# Patient Record
Sex: Female | Born: 1955
Health system: Southern US, Community
[De-identification: ages and names within clinical notes are randomized; demographics above are authoritative.]

## PROBLEM LIST (undated history)

## (undated) DIAGNOSIS — T8859XA Other complications of anesthesia, initial encounter: Secondary | ICD-10-CM

## (undated) DIAGNOSIS — R55 Syncope and collapse: Secondary | ICD-10-CM

## (undated) DIAGNOSIS — B379 Candidiasis, unspecified: Secondary | ICD-10-CM

## (undated) DIAGNOSIS — D682 Hereditary deficiency of other clotting factors: Secondary | ICD-10-CM

## (undated) DIAGNOSIS — D649 Anemia, unspecified: Secondary | ICD-10-CM

## (undated) DIAGNOSIS — I341 Nonrheumatic mitral (valve) prolapse: Secondary | ICD-10-CM

## (undated) DIAGNOSIS — R5383 Other fatigue: Secondary | ICD-10-CM

## (undated) DIAGNOSIS — D126 Benign neoplasm of colon, unspecified: Secondary | ICD-10-CM

## (undated) DIAGNOSIS — Z87442 Personal history of urinary calculi: Secondary | ICD-10-CM

## (undated) DIAGNOSIS — C4491 Basal cell carcinoma of skin, unspecified: Secondary | ICD-10-CM

## (undated) DIAGNOSIS — R7989 Other specified abnormal findings of blood chemistry: Secondary | ICD-10-CM

## (undated) DIAGNOSIS — M8580 Other specified disorders of bone density and structure, unspecified site: Secondary | ICD-10-CM

## (undated) DIAGNOSIS — L719 Rosacea, unspecified: Secondary | ICD-10-CM

## (undated) DIAGNOSIS — H33319 Horseshoe tear of retina without detachment, unspecified eye: Secondary | ICD-10-CM

## (undated) DIAGNOSIS — T4145XA Adverse effect of unspecified anesthetic, initial encounter: Secondary | ICD-10-CM

## (undated) DIAGNOSIS — Z8489 Family history of other specified conditions: Secondary | ICD-10-CM

## (undated) HISTORY — DX: Horseshoe tear of retina without detachment, unspecified eye: H33.319

## (undated) HISTORY — DX: Other specified abnormal findings of blood chemistry: R79.89

## (undated) HISTORY — DX: Syncope and collapse: R55

## (undated) HISTORY — PX: WISDOM TOOTH EXTRACTION: SHX21

## (undated) HISTORY — DX: Rosacea, unspecified: L71.9

## (undated) HISTORY — DX: Basal cell carcinoma of skin, unspecified: C44.91

## (undated) HISTORY — DX: Other fatigue: R53.83

## (undated) HISTORY — DX: Candidiasis, unspecified: B37.9

## (undated) HISTORY — DX: Benign neoplasm of colon, unspecified: D12.6

## (undated) HISTORY — PX: BASAL CELL CARCINOMA EXCISION: SHX1214

## (undated) HISTORY — DX: Hereditary deficiency of other clotting factors: D68.2

## (undated) HISTORY — DX: Other specified disorders of bone density and structure, unspecified site: M85.80

---

## 2005-01-02 ENCOUNTER — Ambulatory Visit: Payer: Self-pay | Admitting: Obstetrics and Gynecology

## 2006-01-08 ENCOUNTER — Ambulatory Visit: Payer: Self-pay | Admitting: Obstetrics and Gynecology

## 2007-01-23 ENCOUNTER — Ambulatory Visit: Payer: Self-pay | Admitting: Obstetrics and Gynecology

## 2008-02-03 ENCOUNTER — Ambulatory Visit: Payer: Self-pay | Admitting: Obstetrics and Gynecology

## 2008-02-16 ENCOUNTER — Ambulatory Visit: Payer: Self-pay | Admitting: Obstetrics and Gynecology

## 2008-05-21 HISTORY — PX: VAGINAL HYSTERECTOMY: SUR661

## 2008-07-27 ENCOUNTER — Ambulatory Visit: Payer: Self-pay | Admitting: Obstetrics and Gynecology

## 2009-03-09 ENCOUNTER — Ambulatory Visit: Payer: Self-pay | Admitting: Obstetrics & Gynecology

## 2010-04-19 ENCOUNTER — Ambulatory Visit: Payer: Self-pay | Admitting: Obstetrics and Gynecology

## 2010-05-21 LAB — HM COLONOSCOPY

## 2010-07-27 ENCOUNTER — Ambulatory Visit: Payer: Self-pay | Admitting: Urology

## 2010-08-07 ENCOUNTER — Ambulatory Visit: Payer: Self-pay | Admitting: Unknown Physician Specialty

## 2010-08-08 LAB — PATHOLOGY REPORT

## 2011-02-26 ENCOUNTER — Ambulatory Visit: Payer: Self-pay | Admitting: Urology

## 2011-08-09 ENCOUNTER — Ambulatory Visit: Payer: Self-pay | Admitting: Obstetrics and Gynecology

## 2011-11-19 ENCOUNTER — Ambulatory Visit: Payer: Self-pay | Admitting: Urology

## 2012-02-26 ENCOUNTER — Ambulatory Visit: Payer: Self-pay | Admitting: Urology

## 2012-08-21 ENCOUNTER — Ambulatory Visit: Payer: Self-pay | Admitting: Obstetrics and Gynecology

## 2013-09-03 ENCOUNTER — Ambulatory Visit: Payer: Self-pay | Admitting: Obstetrics and Gynecology

## 2013-11-12 DIAGNOSIS — M79609 Pain in unspecified limb: Secondary | ICD-10-CM

## 2014-06-17 LAB — HM PAP SMEAR: HM PAP: NEGATIVE

## 2014-09-06 ENCOUNTER — Ambulatory Visit
Admit: 2014-09-06 | Disposition: A | Discharge status: Home / Self Care | Payer: Self-pay | Attending: Obstetrics and Gynecology | Admitting: Obstetrics and Gynecology

## 2014-09-06 LAB — HM MAMMOGRAPHY

## 2015-01-25 ENCOUNTER — Telehealth: Payer: Self-pay | Admitting: Obstetrics and Gynecology

## 2015-01-25 DIAGNOSIS — R143 Flatulence: Secondary | ICD-10-CM

## 2015-01-25 DIAGNOSIS — IMO0001 Reserved for inherently not codable concepts without codable children: Secondary | ICD-10-CM

## 2015-01-25 NOTE — Telephone Encounter (Signed)
Put in a referral per pt request to Margaret May at [facility]. Referral request signed by MAD and mailed to pt.

## 2015-01-25 NOTE — Telephone Encounter (Signed)
PT CALLED ND SHE WORKS AT [employer] AND THEY ARE DOING A NEW WELLNESS PROGRAM AND SHE WANTED TO KOW IF DR DE WOULD DO A REFERRAL TO SEE THE REGISTERED [place], [place].

## 2015-05-17 ENCOUNTER — Telehealth: Payer: Self-pay | Admitting: Obstetrics and Gynecology

## 2015-05-17 NOTE — Telephone Encounter (Signed)
She needs refill on estrogen

## 2015-05-18 MED ORDER — ESTRADIOL 0.025 MG/24HR TD PTTW: 1.0000 | MEDICATED_PATCH | TRANSDERMAL | Status: DC

## 2015-05-18 NOTE — Telephone Encounter (Signed)
Pt requested a 90 days supply sent to [facility] scheduled for 06/2015. Pt aware med erx.

## 2015-05-20 ENCOUNTER — Other Ambulatory Visit: Payer: Self-pay | Admitting: Obstetrics and Gynecology

## 2015-05-22 LAB — FECAL OCCULT BLOOD, IMMUNOCHEMICAL: Fecal Occult Bld: NEGATIVE

## 2015-06-22 ENCOUNTER — Encounter: Payer: Self-pay | Admitting: Obstetrics and Gynecology

## 2015-06-22 ENCOUNTER — Ambulatory Visit (INDEPENDENT_AMBULATORY_CARE_PROVIDER_SITE_OTHER): Payer: BLUE CROSS/BLUE SHIELD | Admitting: Obstetrics and Gynecology

## 2015-06-22 VITALS — BP 134/73 | HR 84 | Ht 65.0 in | Wt 124.8 lb

## 2015-06-22 DIAGNOSIS — D126 Benign neoplasm of colon, unspecified: Secondary | ICD-10-CM | POA: Diagnosis not present

## 2015-06-22 DIAGNOSIS — Z1239 Encounter for other screening for malignant neoplasm of breast: Secondary | ICD-10-CM

## 2015-06-22 DIAGNOSIS — Z8742 Personal history of other diseases of the female genital tract: Secondary | ICD-10-CM

## 2015-06-22 DIAGNOSIS — N951 Menopausal and female climacteric states: Secondary | ICD-10-CM | POA: Insufficient documentation

## 2015-06-22 DIAGNOSIS — M8580 Other specified disorders of bone density and structure, unspecified site: Secondary | ICD-10-CM | POA: Diagnosis not present

## 2015-06-22 DIAGNOSIS — Z87442 Personal history of urinary calculi: Secondary | ICD-10-CM | POA: Diagnosis not present

## 2015-06-22 DIAGNOSIS — Z780 Asymptomatic menopausal state: Secondary | ICD-10-CM | POA: Insufficient documentation

## 2015-06-22 DIAGNOSIS — C44519 Basal cell carcinoma of skin of other part of trunk: Secondary | ICD-10-CM | POA: Diagnosis not present

## 2015-06-22 DIAGNOSIS — Z01419 Encounter for gynecological examination (general) (routine) without abnormal findings: Secondary | ICD-10-CM

## 2015-06-22 MED ORDER — ESTRADIOL 0.025 MG/24HR TD PTTW: 1.0000 | MEDICATED_PATCH | TRANSDERMAL | Status: DC

## 2015-06-22 NOTE — Progress Notes (Signed)
Patient ID: Sharon Le, female   DOB: 1955/08/01, 60 y.o.   MRN: [id number] ANNUAL PREVENTATIVE CARE GYN  ENCOUNTER NOTE  Subjective:       Sharon Le is a 60 y.o. G0P0000 female here for a routine annual gynecologic exam.  Current complaints: 1.  Remote history of abnormal Pap smears; none in the last decade; Want pap- doesn't not like 3 year plan   Major interval health issues include bilateral retinal tears which have healed.  She also has had recent skin excision forRule out melanoma; pathology pending.   Gynecologic History No LMP recorded. Patient has had a hysterectomy.status post [facility] Contraception: post menopausal status Last Pap: 06/17/2014 neg. Results were: normal Last mammogram: birad 1. Results were: normal  Obstetric History OB History  Gravida Para Term Preterm AB SAB TAB Ectopic Multiple Living  0 0 0 0 0 0 0 0 0 0         Past Medical History  Diagnosis Date  . Fatigue   . Kidney stones     h/o  . Adenomatous colon polyp   . 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 Osteopenia   . Vasomotor instability   . Yeast infection   . Abnormal TSH   . Basal cell carcinoma   . Retinal tear     Past Surgical History  Procedure Laterality Date  . Vaginal hysterectomy      lsh  . Basal cell carcinoma excision  abd    Current Outpatient Prescriptions on File Prior to Visit  Medication Sig Dispense Refill  . estradiol (VIVELLE-DOT) 0.025 MG/24HR Place 1 patch onto the skin 2 (two) times a week. 24 patch 0   No current facility-administered medications on file prior to visit.    Allergies no known allergies  Social History   Social History  . Marital Status: Single    Spouse Name: N/A  . Number of Children: N/A  . Years of Education: N/A   Occupational History  . Not on file.   Social History Main Topics  . Smoking status: Never Smoker   . Smokeless tobacco: Not on file  . Alcohol Use: No  . Drug Use: No  . Sexual Activity: Not Currently   Other  Topics Concern  . Not on file   Social History Narrative    Family History  Problem Relation Age of Onset  . Vascular Disease Father   . Colon cancer Paternal Aunt   . Heart disease Maternal Grandmother   . Diabetes Neg Hx   . Breast cancer Neg Hx   . Ovarian cancer Neg Hx     The following portions of the patient's history were reviewed and updated as appropriate: allergies, current medications, past family history, past medical history, past social history, past surgical history and problem list.  Review of Systems ROS Review of Systems - General ROS: negative for - chills, fatigue, fever, hot flashes, night sweats, weight gain or weight loss Psychological ROS: negative for - anxiety, decreased libido, depression, mood swings, physical abuse or sexual abuse Ophthalmic ROS: negative for - blurry vision, eye pain or loss of vision ENT ROS: negative for - headaches, hearing change, visual changes or vocal changes Allergy and Immunology ROS: negative for - hives, itchy/watery eyes or seasonal allergies Hematological and Lymphatic ROS: negative for - bleeding problems, bruising, swollen lymph nodes or weight loss Endocrine ROS: negative for - galactorrhea, hair pattern changes, hot flashes, malaise/lethargy, mood swings, palpitations, polydipsia/polyuria, skin changes, temperature intolerance or  unexpected weight changes Breast ROS: negative for - new or changing breast lumps or nipple discharge Respiratory ROS: negative for - cough or shortness of breath Cardiovascular ROS: negative for - chest pain, irregular heartbeat, palpitations or shortness of breath Gastrointestinal ROS: no abdominal pain, change in bowel habits, or black or bloody stools Genito-Urinary ROS: no dysuria, trouble voiding, or hematuria Musculoskeletal ROS: negative for - joint pain or joint stiffness Neurological ROS: negative for - bowel and bladder control changes Dermatological ROS: negative for rash and skin  lesion changes   Objective:   BP 134/73 mmHg  Pulse 84  Ht 5\' 5"  (1.651 m)  Wt 124 lb 12.8 oz (56.609 kg)  BMI 20.77 kg/m2 CONSTITUTIONAL: Well-developed, well-nourished female in no acute distress.  PSYCHIATRIC: Normal mood and affect. Normal behavior. Normal judgment and thought content. [place]: Alert and oriented to person, place, and time. Normal muscle tone coordination. No cranial nerve deficit noted. HENT:  Normocephalic, atraumatic, External right and left ear normal. Oropharynx is clear and moist EYES: Conjunctivae and EOM are normal. Pupils are equal, round, and reactive to light. No scleral icterus.  NECK: Normal range of motion, supple, no masses.  Normal thyroid.  SKIN: Skin is warm and dry. No rash noted. Not diaphoretic. No erythema. No pallor. CARDIOVASCULAR: Normal heart rate noted, regular rhythm, no murmur. RESPIRATORY: Clear to auscultation bilaterally. Effort and breath sounds normal, no problems with respiration noted. BREASTS: Symmetric in size. No masses, skin changes, nipple drainage, or lymphadenopathy. ABDOMEN: Soft, normal bowel sounds, no distention noted.  No tenderness, rebound or guarding.  BLADDER: Normal PELVIC:  External Genitalia: Normal  BUS: Normal  Vagina: Normal  Cervix: Normal  Uterus: surgically absent  Adnexa: Normal  RV: External Exam NormaI, No Rectal Masses and Normal Sphincter tone  MUSCULOSKELETAL: Normal range of motion. No tenderness.  No cyanosis, clubbing, or edema.  2+ distal pulses. LYMPHATIC: No Axillary, Supraclavicular, or Inguinal Adenopathy.    Assessment:   Annual gynecologic examination 60 y.o. Contraception: post menopausal status bmi-20  Status post [place] Remote history of abnormal Pap smears; patient desires annual Pap smear testing.  Plan:  Pap: Pap, Reflex if ASCUS Mammogram: Ordered Stool Guaiac Testing:  colonoscopy scheduled for 07/2015 Labs: vit d lipid a1c fbs tsh Routine preventative health  maintenance measures emphasized: Exercise/Diet/Weight control, Tobacco Warnings and Alcohol/Substance use risks Vivelle-Dot is refilled for 1 year Return to [place], CMA  Brayton Mars, MD  Note: This dictation was prepared with Dragon dictation along with smaller phrase technology. Any transcriptional errors that result from this process are unintentional.

## 2015-06-22 NOTE — Patient Instructions (Signed)
1.  Pap smear is completed today. 2.  Mammogram is ordered. 3.  Stool guaiac cards are not given because colonoscopy will be done this year. 4.  Continue with healthy eating and exercise. 5.  The Vivelle-Dot patch is refilled for 1 year. 6.  Return in 1 year for physical

## 2015-06-24 LAB — PAP IG W/ RFLX HPV ASCU: PAP Smear Comment: 0

## 2015-06-27 ENCOUNTER — Encounter: Payer: Self-pay | Admitting: Obstetrics and Gynecology

## 2015-08-19 DIAGNOSIS — Z86010 Personal history of colonic polyps: Secondary | ICD-10-CM | POA: Insufficient documentation

## 2015-08-19 DIAGNOSIS — K5909 Other constipation: Secondary | ICD-10-CM | POA: Insufficient documentation

## 2015-09-07 ENCOUNTER — Ambulatory Visit
Admission: RE | Admit: 2015-09-07 | Discharge: 2015-09-07 | Disposition: A | Discharge status: Home / Self Care | Payer: BLUE CROSS/BLUE SHIELD | Source: Ambulatory Visit | Attending: Obstetrics and Gynecology | Admitting: Obstetrics and Gynecology

## 2015-09-07 DIAGNOSIS — Z1231 Encounter for screening mammogram for malignant neoplasm of breast: Secondary | ICD-10-CM | POA: Insufficient documentation

## 2015-09-07 DIAGNOSIS — Z1239 Encounter for other screening for malignant neoplasm of breast: Secondary | ICD-10-CM

## 2015-10-05 ENCOUNTER — Ambulatory Visit: Payer: BLUE CROSS/BLUE SHIELD | Admitting: Anesthesiology

## 2015-10-05 ENCOUNTER — Ambulatory Visit
Admission: RE | Admit: 2015-10-05 | Discharge: 2015-10-05 | Disposition: A | Discharge status: Home / Self Care | Payer: BLUE CROSS/BLUE SHIELD | Source: Ambulatory Visit | Attending: Unknown Physician Specialty | Admitting: Unknown Physician Specialty

## 2015-10-05 ENCOUNTER — Encounter: Payer: Self-pay | Admitting: Anesthesiology

## 2015-10-05 ENCOUNTER — Encounter
Admission: RE | Disposition: A | Discharge status: Home / Self Care | Payer: Self-pay | Source: Ambulatory Visit | Attending: Unknown Physician Specialty

## 2015-10-05 DIAGNOSIS — K648 Other hemorrhoids: Secondary | ICD-10-CM | POA: Diagnosis not present

## 2015-10-05 DIAGNOSIS — Z79899 Other long term (current) drug therapy: Secondary | ICD-10-CM | POA: Diagnosis not present

## 2015-10-05 DIAGNOSIS — M8580 Other specified disorders of bone density and structure, unspecified site: Secondary | ICD-10-CM | POA: Insufficient documentation

## 2015-10-05 DIAGNOSIS — Z87442 Personal history of urinary calculi: Secondary | ICD-10-CM | POA: Diagnosis not present

## 2015-10-05 DIAGNOSIS — Z1211 Encounter for screening for malignant neoplasm of colon: Secondary | ICD-10-CM | POA: Diagnosis not present

## 2015-10-05 DIAGNOSIS — Z85828 Personal history of other malignant neoplasm of skin: Secondary | ICD-10-CM | POA: Diagnosis not present

## 2015-10-05 DIAGNOSIS — Z86010 Personal history of colonic polyps: Secondary | ICD-10-CM | POA: Diagnosis not present

## 2015-10-05 DIAGNOSIS — D125 Benign neoplasm of sigmoid colon: Secondary | ICD-10-CM | POA: Insufficient documentation

## 2015-10-05 DIAGNOSIS — D122 Benign neoplasm of ascending colon: Secondary | ICD-10-CM | POA: Insufficient documentation

## 2015-10-05 DIAGNOSIS — K640 First degree hemorrhoids: Secondary | ICD-10-CM | POA: Diagnosis not present

## 2015-10-05 DIAGNOSIS — K635 Polyp of colon: Secondary | ICD-10-CM | POA: Diagnosis not present

## 2015-10-05 HISTORY — PX: COLONOSCOPY WITH PROPOFOL: SHX5780

## 2015-10-05 SURGERY — COLONOSCOPY WITH PROPOFOL
Anesthesia: General

## 2015-10-05 MED ORDER — SODIUM CHLORIDE 0.9 % IV SOLN
INTRAVENOUS | Status: DC
  Administered 2015-10-05 (×2): via INTRAVENOUS

## 2015-10-05 MED ORDER — SODIUM CHLORIDE 0.9 % IV SOLN: INTRAVENOUS | Status: DC

## 2015-10-05 MED ORDER — PHENYLEPHRINE HCL 10 MG/ML IJ SOLN
INTRAMUSCULAR | Status: DC | PRN
Start: 2015-10-05 — End: 2015-10-05
  Administered 2015-10-05: 100 ug via INTRAVENOUS

## 2015-10-05 MED ORDER — FENTANYL CITRATE (PF) 100 MCG/2ML IJ SOLN
INTRAMUSCULAR | Status: DC | PRN
  Administered 2015-10-05: 50 ug via INTRAVENOUS

## 2015-10-05 MED ORDER — MIDAZOLAM HCL 2 MG/2ML IJ SOLN
INTRAMUSCULAR | Status: DC | PRN
  Administered 2015-10-05: 1 mg via INTRAVENOUS

## 2015-10-05 MED ORDER — PROPOFOL 500 MG/50ML IV EMUL
INTRAVENOUS | Status: DC | PRN
  Administered 2015-10-05: 100 ug/kg/min via INTRAVENOUS

## 2015-10-05 MED ORDER — LIDOCAINE HCL (PF) 1 % IJ SOLN: 2.0000 mL | Freq: Once | INTRAMUSCULAR | Status: DC

## 2015-10-05 NOTE — Anesthesia Procedure Notes (Signed)
Performed by: COOK-MARTIN, Dilraj Killgore Pre-anesthesia Checklist: Patient identified, Emergency Drugs available, Suction available, Patient being monitored and Timeout performed Patient Re-evaluated:Patient Re-evaluated prior to inductionOxygen Delivery Method: Nasal cannula Preoxygenation: Pre-oxygenation with 100% oxygen Intubation Type: IV induction Placement Confirmation: positive ETCO2 and CO2 detector       

## 2015-10-05 NOTE — OR Nursing (Signed)
Hot Snared Sigmoid Colon Polyp that wasn't retrieved.

## 2015-10-05 NOTE — Anesthesia Preprocedure Evaluation (Addendum)
Anesthesia Evaluation  Patient identified by MRN, date of birth, ID band Patient awake    Reviewed: Allergy & Precautions, NPO status , Patient's Chart, lab work & pertinent test results, reviewed documented beta blocker date and time   Airway Mallampati: II  TM Distance: >3 FB     Dental  (+) Chipped   Pulmonary           Cardiovascular      Neuro/Psych    GI/Hepatic   Endo/Other    Renal/GU Renal InsufficiencyRenal disease     Musculoskeletal   Abdominal   Peds  Hematology   Anesthesia Other Findings Hx of retinal tears. Decreased fibrogen.  Reproductive/Obstetrics                            Anesthesia Physical Anesthesia Plan  ASA: III  Anesthesia Plan: General   Post-op Pain Management:    Induction: Intravenous  Airway Management Planned: Nasal Cannula  Additional Equipment:   Intra-op Plan:   Post-operative Plan:   Informed Consent: I have reviewed the patients History and Physical, chart, labs and discussed the procedure including the risks, benefits and alternatives for the proposed anesthesia with the patient or authorized representative who has indicated his/her understanding and acceptance.     Plan Discussed with: CRNA  Anesthesia Plan Comments:         Anesthesia Quick Evaluation

## 2015-10-05 NOTE — Op Note (Signed)
[facility] Gastroenterology Patient Name: Sharon Le Procedure Date: 10/05/2015 1:30 PM MRN: [id number] Account #: [id number] Date of Birth: 1955-09-12 Admit Type: Outpatient Age: 60 Room: [facility] ENDO ROOM 4 Gender: Female Note Status: Finalized Procedure:            Colonoscopy Indications:          High risk colon cancer surveillance: Personal history                        of colonic polyps Providers:            Manya Silvas, MD Referring MD:         Alanda Slim. Defrancesco, MD (Referring MD) Medicines:            Propofol per Anesthesia Complications:        No immediate complications. Procedure:            Pre-Anesthesia Assessment:                       - After reviewing the risks and benefits, the patient                        was deemed in satisfactory condition to undergo the                        procedure.                       After obtaining informed consent, the colonoscope was                        passed under direct vision. Throughout the procedure,                        the patient's blood pressure, pulse, and oxygen                        saturations were monitored continuously. The                        Colonoscope was introduced through the anus and                        advanced to the the cecum, identified by appendiceal                        orifice and ileocecal valve. The colonoscopy was                        somewhat difficult due to a tortuous colon. The patient                        tolerated the procedure well. The quality of the bowel                        preparation was excellent. Findings:      A diminutive polyp was found in the proximal ascending colon. The polyp       was sessile. The polyp was removed with a cold biopsy forceps. Resection       and retrieval were complete.  A diminutive polyp was found in the proximal sigmoid colon. The polyp       was sessile. The polyp was removed with a hot snare.  Resection and       retrieval were complete.      Internal hemorrhoids were found during endoscopy. The hemorrhoids were       small and Grade I (internal hemorrhoids that do not prolapse).      The exam was otherwise without abnormality. Impression:           - One diminutive polyp in the proximal ascending colon,                        removed with a cold biopsy forceps. Resected and                        retrieved.                       - One diminutive polyp in the proximal sigmoid colon,                        removed with a hot snare. Resected and retrieved.                       - Internal hemorrhoids.                       - The examination was otherwise normal. Recommendation:       - Await pathology results. Manya Silvas, MD 10/05/2015 2:06:33 PM This report has been signed electronically. Number of Addenda: 0 Note Initiated On: 10/05/2015 1:30 PM Scope Withdrawal Time: 0 hours 18 minutes 32 seconds  Total Procedure Duration: 0 hours 28 minutes 26 seconds       [facility]

## 2015-10-05 NOTE — Transfer of Care (Signed)
Immediate Anesthesia Transfer of Care Note  Patient: Sharon Le  Procedure(s) Performed: Procedure(s): COLONOSCOPY WITH PROPOFOL (N/A)  Patient Location: Endoscopy Unit  Anesthesia Type:General  Level of Consciousness: sedated  Airway & Oxygen Therapy: Patient connected to nasal cannula oxygen  Post-op Assessment: Post -op Vital signs reviewed and stable  Post vital signs: stable  Last Vitals:  Filed Vitals:   10/05/15 1203 10/05/15 1406  BP: 139/99 126/58  Pulse: 64 68  Temp: 36.1 C 35.7 C  Resp: 17 16    Last Pain: There were no vitals filed for this visit.       Complications: No apparent anesthesia complications

## 2015-10-05 NOTE — H&P (Signed)
   Primary Care Physician:  Brayton Mars, MD Primary Gastroenterologist:  Dr. Vira Agar  Pre-Procedure History & Physical: HPI:  Sharon Le is a 60 y.o. female is here for an colonoscopy.   Past Medical History  Diagnosis Date  . Fatigue   . Kidney stones     h/o  . Adenomatous colon polyp   . Decreased fibrinogen ([place])   . Osteopenia   . Vasomotor instability   . Yeast infection   . Abnormal TSH   . Basal cell carcinoma   . Retinal tear     Past Surgical History  Procedure Laterality Date  . Vaginal hysterectomy      lsh  . Basal cell carcinoma excision  abd    Prior to Admission medications   Medication Sig Start Date End Date Taking? Authorizing Provider  estradiol (VIVELLE-DOT) 0.025 MG/24HR Place 1 patch onto the skin 2 (two) times a week. 06/22/15   Brayton Mars, MD    Allergies as of 09/08/2015  . (No Known Allergies)    Family History  Problem Relation Age of Onset  . Vascular Disease Father   . Colon cancer Paternal Aunt   . Heart disease Maternal Grandmother   . Diabetes Neg Hx   . Breast cancer Neg Hx   . Ovarian cancer Neg Hx     Social History   Social History  . Marital Status: Single    Spouse Name: N/A  . Number of Children: N/A  . Years of Education: N/A   Occupational History  . Not on file.   Social History Main Topics  . Smoking status: Never Smoker   . Smokeless tobacco: Not on file  . Alcohol Use: No  . Drug Use: No  . Sexual Activity: Not Currently   Other Topics Concern  . Not on file   Social History Narrative    Review of Systems: See HPI, otherwise negative ROS  Physical Exam: BP 139/99 mmHg  Pulse 64  Temp(Src) 97 F (36.1 C) (Tympanic)  Resp 17  Ht 5\' 5"  (1.651 m)  Wt 52.617 kg (116 lb)  BMI 19.30 kg/m2  SpO2 100% General:   Alert,  pleasant and cooperative in NAD Head:  Normocephalic and atraumatic. Neck:  Supple; no masses or thyromegaly. Lungs:  Clear throughout to auscultation.     Heart:  Regular rate and rhythm. Abdomen:  Soft, nontender and nondistended. Normal bowel sounds, without guarding, and without rebound.   Neurologic:  Alert and  oriented x4;  grossly normal neurologically.  Impression/Plan: Sharon Le is here for an colonoscopy to be performed for [facility] colon polyps  Risks, benefits, limitations, and alternatives regarding  colonoscopy have been reviewed with the patient.  Questions have been answered.  All parties agreeable.   Gaylyn Cheers, MD  10/05/2015, 12:57 PM

## 2015-10-06 ENCOUNTER — Encounter: Payer: Self-pay | Admitting: Unknown Physician Specialty

## 2015-10-06 NOTE — Anesthesia Postprocedure Evaluation (Signed)
Anesthesia Post Note  Patient: Sharon Le  Procedure(s) Performed: Procedure(s) (LRB): COLONOSCOPY WITH PROPOFOL (N/A)  Patient location during evaluation: Endoscopy Anesthesia Type: General Level of consciousness: awake and alert Pain management: pain level controlled Vital Signs Assessment: post-procedure vital signs reviewed and stable Respiratory status: spontaneous breathing, nonlabored ventilation, respiratory function stable and patient connected to nasal cannula oxygen Cardiovascular status: blood pressure returned to baseline and stable Postop Assessment: no signs of nausea or vomiting Anesthetic complications: no    Last Vitals:  Filed Vitals:   10/05/15 1426 10/05/15 1436  BP: 117/68 125/78  Pulse: 70 59  Temp:    Resp: 16 9    Last Pain: There were no vitals filed for this visit.               Martha Clan

## 2015-10-07 LAB — SURGICAL PATHOLOGY

## 2016-02-03 ENCOUNTER — Telehealth: Payer: Self-pay | Admitting: Obstetrics and Gynecology

## 2016-02-03 NOTE — Telephone Encounter (Signed)
PT CALLED AND STATED THAT SHE FAXED OVER A FORM FROM HER [employer] [employer] FOR A SIGN OFF FOR PARTICIPATING IN A RESEARCH ON CAMPUS. SHE TALKED TO THE DOCTOR THAT IS DOING IT AND THEY HAVE NOT RECEIVED IT SO SHE IS WANTING TO KNOW IF IT HAS BEEN DONE OR NOT, PT AWARE YOU WILL NOT BE BACK IN OFFICE UNTIL Monday AND SHE IS FINE WITH THAT, SHE WOULD LIKE A CALL BACK ON Monday AND SHE STATED THAT YOU CAN LEAVE A MESSAGE ON THE NUMBER ABOVE IF SHE DOES NOT ANSWER.

## 2016-02-06 NOTE — Telephone Encounter (Signed)
Pt aware per vm form mailed out Thursday- 02/02/16.

## 2016-03-05 ENCOUNTER — Ambulatory Visit (INDEPENDENT_AMBULATORY_CARE_PROVIDER_SITE_OTHER): Payer: BLUE CROSS/BLUE SHIELD | Admitting: Podiatry

## 2016-03-05 ENCOUNTER — Encounter: Payer: Self-pay | Admitting: *Deleted

## 2016-03-05 ENCOUNTER — Ambulatory Visit (INDEPENDENT_AMBULATORY_CARE_PROVIDER_SITE_OTHER): Payer: BLUE CROSS/BLUE SHIELD

## 2016-03-05 ENCOUNTER — Encounter: Payer: Self-pay | Admitting: Podiatry

## 2016-03-05 DIAGNOSIS — M7750 Other enthesopathy of unspecified foot: Secondary | ICD-10-CM

## 2016-03-05 DIAGNOSIS — M778 Other enthesopathies, not elsewhere classified: Secondary | ICD-10-CM

## 2016-03-05 DIAGNOSIS — Q828 Other specified congenital malformations of skin: Secondary | ICD-10-CM

## 2016-03-05 DIAGNOSIS — M779 Enthesopathy, unspecified: Principal | ICD-10-CM

## 2016-03-05 NOTE — Progress Notes (Signed)
   Subjective:    Patient ID: Sharon Le, female    DOB: 1955/12/14, 60 y.o.   MRN: [id number]  HPI: She presents today with a chief concern of sub-first metatarsophalangeal joint pain and tenderness right greater than left for the past several weeks. She's noticed that she is getting more callused areas as time goes on and is becoming more painful to walk.    Review of Systems  Neurological: Positive for headaches.  All other systems reviewed and are negative.      Objective:   Physical Exam vital signs are stable she is alert and oriented 3. Pulses are palpable. Neurologic sensorium is intact. Deep tendon reflexes are intact. Muscle strength is 5 over 5 dorsiflexion plantar flexors and inverters everters all intrinsic musculature is intact. Orthopedic evaluation demonstrates all joints distal to the ankle for range of motion without crepitation. Cutaneous evaluation demonstrates supple well-hydrated cutis multiple porokeratotic lesions and tyloma plantar aspect of bilateral foot appear to be slightly diffuse due to debridement. Nontender on palpation I see no signs of infection or onychomycosis or tinea pedis at this point.        Assessment & Plan:  Metatarsalgia with capsulitis and tyloma is bilateral.  Plan: Discussed appropriate shoe gear debridement devices and will follow up with her as needed.

## 2016-04-04 DIAGNOSIS — J029 Acute pharyngitis, unspecified: Secondary | ICD-10-CM | POA: Diagnosis not present

## 2016-05-24 ENCOUNTER — Telehealth: Payer: Self-pay | Admitting: Obstetrics and Gynecology

## 2016-05-24 DIAGNOSIS — Z01419 Encounter for gynecological examination (general) (routine) without abnormal findings: Secondary | ICD-10-CM

## 2016-05-24 NOTE — Telephone Encounter (Signed)
PT CALLED AND STATED SHE WOULD LIKE FOR YOU TO CALL HER BACK BEFORE 5 TODAY.

## 2016-05-24 NOTE — Telephone Encounter (Signed)
Pt request her annual lab work to be drawn prior to her 06/2016 appt. Per her request faxed to her work- [phone] with conf.

## 2016-05-31 DIAGNOSIS — Z85828 Personal history of other malignant neoplasm of skin: Secondary | ICD-10-CM | POA: Diagnosis not present

## 2016-06-25 ENCOUNTER — Other Ambulatory Visit: Payer: Self-pay

## 2016-06-25 MED ORDER — ESTRADIOL 0.025 MG/24HR TD PTTW: 1.0000 | MEDICATED_PATCH | TRANSDERMAL | 3 refills | Status: DC

## 2016-06-27 ENCOUNTER — Encounter: Payer: BLUE CROSS/BLUE SHIELD | Admitting: Obstetrics and Gynecology

## 2016-07-05 NOTE — Progress Notes (Signed)
Patient ID: Sharon Le, female   DOB: Oct 21, 1955, 61 y.o.   MRN: [id number] ANNUAL PREVENTATIVE CARE GYN  ENCOUNTER NOTE  Subjective:       Sharon Le is a 61 y.o. G0P0000 female here for a routine annual gynecologic exam.  Current complaints: 1.  Remote history of abnormal Pap smears; none in the last decade; Want pap- doesn't not like 3 year plan  2. Wants pap q year until age 89  3. With elevated tsh- wants to see Dr. Gabriel Carina; Patient is asymptomatic from thyroid disease. No weight gain, intolerance, skin changes, hair loss, mental status changes. We'll recommend repeating thyroid panel in 6 months.  Gynecologic History No LMP recorded. Patient has had a hysterectomy.status post [facility] Contraception: post menopausal status Last Pap: 06/22/2015 neg. Results were: normal Last mammogram:09/08/2015  birad 1. Results were: normal  Obstetric History OB History  Gravida Para Term Preterm AB Living  0 0 0 0 0 0  SAB TAB Ectopic Multiple Live Births  0 0 0 0          Past Medical History:  Diagnosis Date  . Abnormal TSH   . Adenomatous colon polyp   . Basal cell carcinoma   . Decreased fibrinogen ([place])   . Fatigue   . Kidney stones    h/o  . Osteopenia   . Retinal tear   . Vasomotor instability   . Yeast infection     Past Surgical History:  Procedure Laterality Date  . BASAL CELL CARCINOMA EXCISION  abd  . COLONOSCOPY WITH PROPOFOL N/A 10/05/2015   Procedure: COLONOSCOPY WITH PROPOFOL;  Surgeon: Manya Silvas, MD;  Location: [facility] ENDOSCOPY;  Service: Endoscopy;  Laterality: N/A;  . VAGINAL HYSTERECTOMY     lsh    Current Outpatient Prescriptions on File Prior to Visit  Medication Sig Dispense Refill  . Azelaic Acid (FINACEA EX) Apply topically.    Marland Kitchen estradiol (VIVELLE-DOT) 0.025 MG/24HR Place 1 patch onto the skin 2 (two) times a week. 24 patch 3  . olopatadine (PATANOL) 0.1 % ophthalmic solution 1 drop 2 (two) times daily.     No current facility-administered  medications on file prior to visit.     No Known Allergies  Social History   Social History  . Marital status: Single    Spouse name: N/A  . Number of children: N/A  . Years of education: N/A   Occupational History  . Not on file.   Social History Main Topics  . Smoking status: Never Smoker  . Smokeless tobacco: Not on file  . Alcohol use No  . Drug use: No  . Sexual activity: Not Currently   Other Topics Concern  . Not on file   Social History Narrative  . No narrative on file    Family History  Problem Relation Age of Onset  . Vascular Disease Father   . Colon cancer Paternal Aunt   . Heart disease Maternal Grandmother   . Diabetes Neg Hx   . Breast cancer Neg Hx   . Ovarian cancer Neg Hx     The following portions of the patient's history were reviewed and updated as appropriate: allergies, current medications, past family history, past medical history, past social history, past surgical history and problem list.  Review of Systems ROS Review of Systems - General ROS: negative for - chills, fatigue, fever, hot flashes, night sweats, weight gain or weight loss Psychological ROS: negative for - anxiety, decreased libido, depression, mood  swings, physical abuse or sexual abuse Ophthalmic ROS: negative for - blurry vision, eye pain or loss of vision ENT ROS: negative for - headaches, hearing change, visual changes or vocal changes Allergy and Immunology ROS: negative for - hives, itchy/watery eyes or seasonal allergies Hematological and Lymphatic ROS: negative for - bleeding problems, bruising, swollen lymph nodes or weight loss Endocrine ROS: negative for - galactorrhea, hair pattern changes, hot flashes, malaise/lethargy, mood swings, palpitations, polydipsia/polyuria, skin changes, temperature intolerance or unexpected weight changes Breast ROS: negative for - new or changing breast lumps or nipple discharge Respiratory ROS: negative for - cough or shortness of  breath Cardiovascular ROS: negative for - chest pain, irregular heartbeat, palpitations or shortness of breath Gastrointestinal ROS: no abdominal pain, change in bowel habits, or black or bloody stools Genito-Urinary ROS: no dysuria, trouble voiding, or hematuria Musculoskeletal ROS: negative for - joint pain or joint stiffness Neurological ROS: negative for - bowel and bladder control changes Dermatological ROS: negative for rash and skin lesion changes   Objective:    BP 127/68   Pulse 80   Ht 5\' 5"  (1.651 m)   Wt 120 lb 12.8 oz (54.8 kg)   BMI 20.10 kg/m   CONSTITUTIONAL: Well-developed, well-nourished female in no acute distress.  PSYCHIATRIC: Normal mood and affect. Normal behavior. Normal judgment and thought content. [place]: Alert and oriented to person, place, and time. Normal muscle tone coordination. No cranial nerve deficit noted. HENT:  Normocephalic, atraumatic, External right and left ear normal. Oropharynx is clear and moist EYES: Conjunctivae and EOM are normal.No scleral icterus.  NECK: Normal range of motion, supple, no masses.  Normal thyroid.  SKIN: Skin is warm and dry. No rash noted. Not diaphoretic. No erythema. No pallor. CARDIOVASCULAR: Normal heart rate noted, regular rhythm, no murmur. RESPIRATORY: Clear to auscultation bilaterally. Effort and breath sounds normal, no problems with respiration noted. BREASTS: Symmetric in size. No masses, skin changes, nipple drainage, or lymphadenopathy. ABDOMEN: Soft, normal bowel sounds, no distention noted.  No tenderness, rebound or guarding.  BLADDER: Normal PELVIC:  External Genitalia: Normal  BUS: Normal  Vagina: Normal; Atrophic changes; single digit exam performed  Cervix: Normal; stenotic os  Uterus: surgically absent  Adnexa: Normal; nonpalpable and nontender  RV: External Exam NormaI, No Rectal Masses and Normal Sphincter tone  MUSCULOSKELETAL: Normal range of motion. No tenderness.  No cyanosis,  clubbing, or edema.  2+ distal pulses. LYMPHATIC: No Axillary, Supraclavicular, or Inguinal Adenopathy.    Assessment:   Annual gynecologic examination 61 y.o. Contraception: post menopausal status bmi-20  Status post [place] Remote history of abnormal Pap smears; patient desires annual Pap smear testing. Menopause, asymptomatic on ERT therapy; desires to continue  Plan:  Pap: Pap, Reflex if ASCUS Mammogram: Ordered Stool Guaiac Testing: colonoscopy 09/2015  Labs: vit d lipid a1c fbs tsh- done 05/24/2016 Routine preventative health maintenance measures emphasized: Exercise/Diet/Weight control, Tobacco Warnings and Alcohol/Substance use risks Vivelle-Dot is refilled for 1 year Return to [place], CMA  Brayton Mars, MD  Note: This dictation was prepared with Dragon dictation along with smaller phrase technology. Any transcriptional errors that result from this process are unintentional.

## 2016-07-10 ENCOUNTER — Encounter: Payer: Self-pay | Admitting: Obstetrics and Gynecology

## 2016-07-10 ENCOUNTER — Ambulatory Visit (INDEPENDENT_AMBULATORY_CARE_PROVIDER_SITE_OTHER): Payer: BLUE CROSS/BLUE SHIELD | Admitting: Obstetrics and Gynecology

## 2016-07-10 VITALS — BP 127/68 | HR 80 | Ht 65.0 in | Wt 120.8 lb

## 2016-07-10 DIAGNOSIS — M8580 Other specified disorders of bone density and structure, unspecified site: Secondary | ICD-10-CM

## 2016-07-10 DIAGNOSIS — C44519 Basal cell carcinoma of skin of other part of trunk: Secondary | ICD-10-CM | POA: Diagnosis not present

## 2016-07-10 DIAGNOSIS — Z1231 Encounter for screening mammogram for malignant neoplasm of breast: Secondary | ICD-10-CM | POA: Diagnosis not present

## 2016-07-10 DIAGNOSIS — Z87442 Personal history of urinary calculi: Secondary | ICD-10-CM | POA: Diagnosis not present

## 2016-07-10 DIAGNOSIS — Z01419 Encounter for gynecological examination (general) (routine) without abnormal findings: Secondary | ICD-10-CM | POA: Diagnosis not present

## 2016-07-10 DIAGNOSIS — Z780 Asymptomatic menopausal state: Secondary | ICD-10-CM

## 2016-07-10 DIAGNOSIS — Z90711 Acquired absence of uterus with remaining cervical stump: Secondary | ICD-10-CM

## 2016-07-10 DIAGNOSIS — Z8742 Personal history of other diseases of the female genital tract: Secondary | ICD-10-CM

## 2016-07-10 DIAGNOSIS — Z1239 Encounter for other screening for malignant neoplasm of breast: Secondary | ICD-10-CM

## 2016-07-10 DIAGNOSIS — Z87898 Personal history of other specified conditions: Secondary | ICD-10-CM | POA: Diagnosis not present

## 2016-07-10 NOTE — Patient Instructions (Signed)
1. Pap smear is done 2. Mammogram ordered 3. Stool guaiac cards are deferred due to colonoscopy in 2017 4. Screening labs have already been obtained 5. Patient is to have repeat thyroid panel in 6 months 6. Continue with healthy eating and exercise 7. Return in 1 year   Health Maintenance for Postmenopausal Women Introduction Menopause is a normal process in which your reproductive ability comes to an end. This process happens gradually over a span of months to years, usually between the ages of 57 and 8. Menopause is complete when you have missed 12 consecutive menstrual periods. It is important to talk with your health care provider about some of the most common conditions that affect postmenopausal women, such as heart disease, cancer, and bone loss (osteoporosis). Adopting a healthy lifestyle and getting preventive care can help to promote your health and wellness. Those actions can also lower your chances of developing some of these common conditions. What should I know about menopause? During menopause, you may experience a number of symptoms, such as:  Moderate-to-severe hot flashes.  Night sweats.  Decrease in sex drive.  Mood swings.  Headaches.  Tiredness.  Irritability.  Memory problems.  Insomnia. Choosing to treat or not to treat menopausal changes is an individual decision that you make with your health care provider. What should I know about hormone replacement therapy and supplements? Hormone therapy products are effective for treating symptoms that are associated with menopause, such as hot flashes and night sweats. Hormone replacement carries certain risks, especially as you become older. If you are thinking about using estrogen or estrogen with progestin treatments, discuss the benefits and risks with your health care provider. What should I know about heart disease and stroke? Heart disease, heart attack, and stroke become more likely as you age. This may be  due, in part, to the hormonal changes that your body experiences during menopause. These can affect how your body processes dietary fats, triglycerides, and cholesterol. Heart attack and stroke are both medical emergencies. There are many things that you can do to help prevent heart disease and stroke:  Have your blood pressure checked at least every 1-2 years. High blood pressure causes heart disease and increases the risk of stroke.  If you are 29-72 years old, ask your health care provider if you should take aspirin to prevent a heart attack or a stroke.  Do not use any tobacco products, including cigarettes, chewing tobacco, or electronic cigarettes. If you need help quitting, ask your health care provider.  It is important to eat a healthy diet and maintain a healthy weight.  Be sure to include plenty of vegetables, fruits, low-fat dairy products, and lean protein.  Avoid eating foods that are high in solid fats, added sugars, or salt (sodium).  Get regular exercise. This is one of the most important things that you can do for your health.  Try to exercise for at least 150 minutes each week. The type of exercise that you do should increase your heart rate and make you sweat. This is known as moderate-intensity exercise.  Try to do strengthening exercises at least twice each week. Do these in addition to the moderate-intensity exercise.  Know your numbers.Ask your health care provider to check your cholesterol and your blood glucose. Continue to have your blood tested as directed by your health care provider. What should I know about cancer screening? There are several types of cancer. Take the following steps to reduce your risk and to catch  any cancer development as early as possible. Breast Cancer  Practice breast self-awareness.  This means understanding how your breasts normally appear and feel.  It also means doing regular breast self-exams. Let your health care provider know  about any changes, no matter how small.  If you are 57 or older, have a clinician do a breast exam (clinical breast exam or CBE) every year. Depending on your age, family history, and medical history, it may be recommended that you also have a yearly breast X-ray (mammogram).  If you have a family history of breast cancer, talk with your health care provider about genetic screening.  If you are at high risk for breast cancer, talk with your health care provider about having an MRI and a mammogram every year.  Breast cancer (BRCA) gene test is recommended for women who have family members with BRCA-related cancers. Results of the assessment will determine the need for genetic counseling and BRCA1 and for BRCA2 testing. BRCA-related cancers include these types:  Breast. This occurs in males or females.  Ovarian.  Tubal. This may also be called fallopian tube cancer.  Cancer of the abdominal or pelvic lining (peritoneal cancer).  Prostate.  Pancreatic. Cervical, Uterine, and Ovarian Cancer  Your health care provider may recommend that you be screened regularly for cancer of the pelvic organs. These include your ovaries, uterus, and vagina. This screening involves a pelvic exam, which includes checking for microscopic changes to the surface of your cervix (Pap test).  For women ages 21-65, health care providers may recommend a pelvic exam and a Pap test every three years. For women ages 64-65, they may recommend the Pap test and pelvic exam, combined with testing for human papilloma virus (HPV), every five years. Some types of HPV increase your risk of cervical cancer. Testing for HPV may also be done on women of any age who have unclear Pap test results.  Other health care providers may not recommend any screening for nonpregnant women who are considered low risk for pelvic cancer and have no symptoms. Ask your health care provider if a screening pelvic exam is right for you.  If you have  had past treatment for cervical cancer or a condition that could lead to cancer, you need Pap tests and screening for cancer for at least 20 years after your treatment. If Pap tests have been discontinued for you, your risk factors (such as having a new sexual partner) need to be reassessed to determine if you should start having screenings again. Some women have medical problems that increase the chance of getting cervical cancer. In these cases, your health care provider may recommend that you have screening and Pap tests more often.  If you have a family history of uterine cancer or ovarian cancer, talk with your health care provider about genetic screening.  If you have vaginal bleeding after reaching menopause, tell your health care provider.  There are currently no reliable tests available to screen for ovarian cancer. Lung Cancer  Lung cancer screening is recommended for adults 7-20 years old who are at high risk for lung cancer because of a history of smoking. A yearly low-dose CT scan of the lungs is recommended if you:  Currently smoke.  Have a history of at least 30 pack-years of smoking and you currently smoke or have quit within the past 15 years. A pack-year is smoking an average of one pack of cigarettes per day for one year. Yearly screening should:  Continue until  it has been 15 years since you quit.  Stop if you develop a health problem that would prevent you from having lung cancer treatment. Colorectal Cancer  This type of cancer can be detected and can often be prevented.  Routine colorectal cancer screening usually begins at age 59 and continues through age 30.  If you have risk factors for colon cancer, your health care provider may recommend that you be screened at an earlier age.  If you have a family history of colorectal cancer, talk with your health care provider about genetic screening.  Your health care provider may also recommend using home test kits to  check for hidden blood in your stool.  A small camera at the end of a tube can be used to examine your colon directly (sigmoidoscopy or colonoscopy). This is done to check for the earliest forms of colorectal cancer.  Direct examination of the colon should be repeated every 5-10 years until age 70. However, if early forms of precancerous polyps or small growths are found or if you have a family history or genetic risk for colorectal cancer, you may need to be screened more often. Skin Cancer  Check your skin from head to toe regularly.  Monitor any moles. Be sure to tell your health care provider:  About any new moles or changes in moles, especially if there is a change in a mole's shape or color.  If you have a mole that is larger than the size of a pencil eraser.  If any of your family members has a history of skin cancer, especially at a young age, talk with your health care provider about genetic screening.  Always use sunscreen. Apply sunscreen liberally and repeatedly throughout the day.  Whenever you are outside, protect yourself by wearing long sleeves, pants, a wide-brimmed hat, and sunglasses. What should I know about osteoporosis? Osteoporosis is a condition in which bone destruction happens more quickly than new bone creation. After menopause, you may be at an increased risk for osteoporosis. To help prevent osteoporosis or the bone fractures that can happen because of osteoporosis, the following is recommended:  If you are 44-38 years old, get at least 1,000 mg of calcium and at least 600 mg of vitamin D per day.  If you are older than age 42 but younger than age 86, get at least 1,200 mg of calcium and at least 600 mg of vitamin D per day.  If you are older than age 76, get at least 1,200 mg of calcium and at least 800 mg of vitamin D per day. Smoking and excessive alcohol intake increase the risk of osteoporosis. Eat foods that are rich in calcium and vitamin D, and do  weight-bearing exercises several times each week as directed by your health care provider. What should I know about how menopause affects my mental health? Depression may occur at any age, but it is more common as you become older. Common symptoms of depression include:  Low or sad mood.  Changes in sleep patterns.  Changes in appetite or eating patterns.  Feeling an overall lack of motivation or enjoyment of activities that you previously enjoyed.  Frequent crying spells. Talk with your health care provider if you think that you are experiencing depression. What should I know about immunizations? It is important that you get and maintain your immunizations. These include:  Tetanus, diphtheria, and pertussis (Tdap) booster vaccine.  Influenza every year before the flu season begins.  Pneumonia vaccine.  Shingles vaccine. Your health care provider may also recommend other immunizations. This information is not intended to replace advice given to you by your health care provider. Make sure you discuss any questions you have with your health care provider. Document Released: 06/29/2005 Document Revised: 11/25/2015 Document Reviewed: 02/08/2015  2017 Elsevier

## 2016-07-12 LAB — PAP IG W/ RFLX HPV ASCU: PAP SMEAR COMMENT: 0

## 2016-09-11 ENCOUNTER — Ambulatory Visit: Payer: BLUE CROSS/BLUE SHIELD

## 2016-09-17 ENCOUNTER — Ambulatory Visit
Admission: RE | Admit: 2016-09-17 | Discharge: 2016-09-17 | Disposition: A | Discharge status: Home / Self Care | Payer: BLUE CROSS/BLUE SHIELD | Source: Ambulatory Visit | Attending: Obstetrics and Gynecology | Admitting: Obstetrics and Gynecology

## 2016-09-17 DIAGNOSIS — Z1239 Encounter for other screening for malignant neoplasm of breast: Secondary | ICD-10-CM

## 2016-09-17 DIAGNOSIS — Z1231 Encounter for screening mammogram for malignant neoplasm of breast: Secondary | ICD-10-CM | POA: Diagnosis not present

## 2016-09-19 ENCOUNTER — Other Ambulatory Visit: Payer: Self-pay | Admitting: Obstetrics and Gynecology

## 2016-09-19 DIAGNOSIS — R928 Other abnormal and inconclusive findings on diagnostic imaging of breast: Secondary | ICD-10-CM

## 2016-09-19 DIAGNOSIS — N6310 Unspecified lump in the right breast, unspecified quadrant: Secondary | ICD-10-CM

## 2016-09-27 ENCOUNTER — Telehealth: Payer: Self-pay | Admitting: Obstetrics and Gynecology

## 2016-09-27 NOTE — Telephone Encounter (Signed)
Patient called with a question regarding an old mammo report that may be in her records in the old system. She would like a call back. Thanks

## 2016-09-28 NOTE — Telephone Encounter (Signed)
lmtrc

## 2016-10-01 NOTE — Telephone Encounter (Signed)
lmtrc

## 2016-10-02 ENCOUNTER — Ambulatory Visit
Admission: RE | Admit: 2016-10-02 | Discharge: 2016-10-02 | Disposition: A | Discharge status: Home / Self Care | Payer: BLUE CROSS/BLUE SHIELD | Source: Ambulatory Visit | Attending: Obstetrics and Gynecology | Admitting: Obstetrics and Gynecology

## 2016-10-02 DIAGNOSIS — N6489 Other specified disorders of breast: Secondary | ICD-10-CM | POA: Diagnosis not present

## 2016-10-02 DIAGNOSIS — N6310 Unspecified lump in the right breast, unspecified quadrant: Secondary | ICD-10-CM | POA: Diagnosis not present

## 2016-10-02 DIAGNOSIS — R928 Other abnormal and inconclusive findings on diagnostic imaging of breast: Secondary | ICD-10-CM | POA: Diagnosis not present

## 2016-10-02 DIAGNOSIS — R922 Inconclusive mammogram: Secondary | ICD-10-CM | POA: Diagnosis not present

## 2016-10-02 NOTE — Telephone Encounter (Signed)
lmtrc

## 2016-10-03 NOTE — Telephone Encounter (Signed)
Tried pt x 3. Chart filed.

## 2017-03-15 ENCOUNTER — Telehealth: Payer: Self-pay | Admitting: Obstetrics and Gynecology

## 2017-03-15 NOTE — Telephone Encounter (Signed)
Patient called and lvm stating that she would like to speak to Dr. Duard Brady Nurse Joyice Faster. No other information was disclosed on the voicemail, Please advise.

## 2017-03-18 NOTE — Telephone Encounter (Signed)
Pt has had a bloody nipple d/c x 3weeks. Very minimal. No pain or trauma to the breast. Appt made for 11/1 at 9:45.

## 2017-03-18 NOTE — Telephone Encounter (Signed)
LMTRC

## 2017-03-21 ENCOUNTER — Ambulatory Visit (INDEPENDENT_AMBULATORY_CARE_PROVIDER_SITE_OTHER): Payer: BLUE CROSS/BLUE SHIELD | Admitting: Obstetrics and Gynecology

## 2017-03-21 ENCOUNTER — Encounter: Payer: Self-pay | Admitting: Obstetrics and Gynecology

## 2017-03-21 VITALS — BP 109/67 | HR 66 | Ht 65.0 in | Wt 121.6 lb

## 2017-03-21 DIAGNOSIS — N6452 Nipple discharge: Secondary | ICD-10-CM | POA: Diagnosis not present

## 2017-03-21 NOTE — Patient Instructions (Signed)
1.  TSH and prolactin level are ordered today 2.  Diagnostic mammogram and ultrasound are ordered today 3.  Results will be made available including recommendations for further management planning 4.  To keep annual exam as scheduled in February 2019

## 2017-03-21 NOTE — Progress Notes (Signed)
Chief complaint: 1.  Left nipple discharge  Sharon Le presents today for evaluation of a 3-week history of left nipple discharge.  She noted secretions within her bra cup on a daily basis.  The discoloration ranged from clear to slightly pink tinged.  She denies breast pain.  She denies trauma to the breast.  She denies new palpable masses.  No symptoms are notable in the right breast.  Past Medical History:  Diagnosis Date  . Abnormal TSH   . Adenomatous colon polyp   . Basal cell carcinoma   . Decreased fibrinogen ([place])   . Fatigue   . Kidney stones    h/o  . Osteopenia   . Retinal tear   . Vasomotor instability   . Yeast infection    Past Surgical History:  Procedure Laterality Date  . BASAL CELL CARCINOMA EXCISION  abd  . COLONOSCOPY WITH PROPOFOL N/A 10/05/2015   Procedure: COLONOSCOPY WITH PROPOFOL;  Surgeon: Manya Silvas, MD;  [facility] ENDOSCOPY;  Service: Endoscopy;  Laterality: N/A;  . VAGINAL HYSTERECTOMY     lsh    Review of systems: As noted in the HPI.  Comprehensive review of systems is otherwise negative.  OBJECTIVE: BP 109/67   Pulse 66   Ht 5\' 5"  (1.651 m)   Wt 121 lb 9.6 oz (55.2 kg)   BMI 20.24 kg/m  Pleasant well-appearing female in no acute distress.  Alert and oriented. Neck: Supple, without thyromegaly or adenopathy Eyes: No exophthalmos; no lid lag; no nystagmus Lymph node survey: No supraclavicular or axillary adenopathy Breast: Bilateral symmetry noted.  No skin changes.  No dominant mass.  Nipples are everted.  Left nipple is notable for minimal clear secretions.  ASSESSMENT: 1.  Left nipple discharge, new onset 2.  History of elevated TSH  PLAN: 1.  TSH 2.  Prolactin level 3.  Diagnostic mammogram and ultrasound 4.  Further management planning following results; anticipate possible surgical consult. 5.  Annual exam as scheduled in February 2019  A total of 15 minutes were spent face-to-face with the patient during this encounter  and over half of that time dealt with counseling and coordination of care.  Brayton Mars, MD  Note: This dictation was prepared with Dragon dictation along with smaller phrase technology. Any transcriptional errors that result from this process are unintentional.

## 2017-03-22 LAB — PROLACTIN: PROLACTIN: 11.7 ng/mL (ref 4.8–23.3)

## 2017-03-22 LAB — TSH: TSH: 5.32 u[IU]/mL — ABNORMAL HIGH (ref 0.450–4.500)

## 2017-03-22 NOTE — Addendum Note (Signed)
Addended by: Elouise Munroe on: 03/22/2017 11:34 AM   Modules accepted: Orders

## 2017-04-01 ENCOUNTER — Encounter: Payer: Self-pay | Admitting: Medical

## 2017-04-01 ENCOUNTER — Ambulatory Visit: Payer: BLUE CROSS/BLUE SHIELD | Admitting: Medical

## 2017-04-01 VITALS — BP 128/74 | HR 60 | Temp 96.4°F | Resp 16 | Ht 65.0 in | Wt 123.0 lb

## 2017-04-01 DIAGNOSIS — N3001 Acute cystitis with hematuria: Secondary | ICD-10-CM

## 2017-04-01 DIAGNOSIS — R300 Dysuria: Secondary | ICD-10-CM

## 2017-04-01 LAB — POCT URINALYSIS DIPSTICK
Bilirubin, UA: NEGATIVE
Glucose, UA: NEGATIVE
KETONES UA: NEGATIVE
Nitrite, UA: NEGATIVE
PH UA: 7.5 (ref 5.0–8.0)
PROTEIN UA: NEGATIVE
Urobilinogen, UA: NEGATIVE E.U./dL — AB

## 2017-04-01 MED ORDER — NITROFURANTOIN MONOHYD MACRO 100 MG PO CAPS: 100.0000 mg | ORAL_CAPSULE | Freq: Two times a day (BID) | ORAL | 0 refills | Status: DC

## 2017-04-01 NOTE — Progress Notes (Addendum)
   Subjective:    Patient ID: Sharon Le, female    DOB: [birth year], 61 y.o.   MRN: [id number]  HPI 61 yo female non acute distress,with burning and pressure on urination " maybe a couple of weeks".  Being seen for nipple disharge and seeing Dr. Achilles Dunk for work up.Marland Kitchen Pending mammogram and ultratsound. Frequency " no more than my normal I drink lots of water" Denies fever or chillls or urgency.    Review of Systems  Constitutional: Negative for chills and fever.  HENT: Negative for congestion, ear pain and sore throat.   Eyes: Negative for discharge and itching.  Respiratory: Negative for cough and shortness of breath.   Cardiovascular: Negative for chest pain, palpitations and leg swelling.  Gastrointestinal: Negative for abdominal pain.  Endocrine: Negative for cold intolerance and heat intolerance.  Genitourinary: Positive for dysuria, frequency and urgency. Negative for decreased urine volume.  Musculoskeletal: Negative for myalgias.  Skin: Negative for rash.  Allergic/Immunologic: Negative for environmental allergies and food allergies.  Neurological: Negative for dizziness, syncope and light-headedness.  Psychiatric/Behavioral: Negative for behavioral problems, self-injury and suicidal ideas. The patient is not nervous/anxious.        Objective:   Physical Exam  Constitutional: She is oriented to person, place, and time. She appears well-developed and well-nourished.  HENT:  Head: Normocephalic and atraumatic.  Eyes: Conjunctivae and EOM are normal. Pupils are equal, round, and reactive to light.  Abdominal: Soft. She exhibits no distension. There is no tenderness.  Neurological: She is alert and oriented to person, place, and time.  Skin: Skin is warm and dry.  Psychiatric: She has a normal mood and affect. Her behavior is normal. Judgment and thought content normal.  Nursing note and vitals reviewed.   No CVA tenderness  no pressure suprapubic  " I just went to  the bathroom for you "         Assessment & Plan:  Urinary tract infection with hematuria Meds ordered this encounter  Medications  . nitrofurantoin, macrocrystal-monohydrate, (MACROBID) 100 MG capsule    Sig: Take 1 capsule (100 mg total) 2 (two) times daily by mouth.    Dispense:  14 capsule    Refill:  0  Continue to drink plenty of water. Return to the clinic in 3 - 5 days if not improving or worsening. Patient verbalizes understanding and has no questions at discharge.

## 2017-04-01 NOTE — Addendum Note (Signed)
Addended by: RATCLIFFE, Nira Conn R on: 04/01/2017 11:36 AM   Modules accepted: Orders

## 2017-04-01 NOTE — Patient Instructions (Addendum)
Return to the clinic in 3-5 days if not improving. Urinary Tract Infection, Adult A urinary tract infection (UTI) is an infection of any part of the urinary tract. The urinary tract includes the:  Kidneys.  Ureters.  Bladder.  Urethra.  These organs make, store, and get rid of pee (urine) in the body. Follow these instructions at home:  Take over-the-counter and prescription medicines only as told by your doctor.  If you were prescribed an antibiotic medicine, take it as told by your doctor. Do not stop taking the antibiotic even if you start to feel better.  Avoid the following drinks: ? Alcohol. ? Caffeine. ? Tea. ? Carbonated drinks.  Drink enough fluid to keep your pee clear or pale yellow.  Keep all follow-up visits as told by your doctor. This is important.  Make sure to: ? Empty your bladder often and completely. Do not to hold pee for long periods of time. ? Empty your bladder before and after sex. ? Wipe from front to back after a bowel movement if you are female. Use each tissue one time when you wipe. Contact a doctor if:  You have back pain.  You have a fever.  You feel sick to your stomach (nauseous).  You throw up (vomit).  Your symptoms do not get better after 3 days.  Your symptoms go away and then come back. Get help right away if:  You have very bad back pain.  You have very bad lower belly (abdominal) pain.  You are throwing up and cannot keep down any medicines or water. This information is not intended to replace advice given to you by your health care provider. Make sure you discuss any questions you have with your health care provider. Document Released: 10/24/2007 Document Revised: 10/13/2015 Document Reviewed: 03/28/2015 Elsevier Interactive Patient Education  [employer].

## 2017-04-01 NOTE — Addendum Note (Signed)
Addended by: Phineas Mcenroe, Nira Conn R on: 04/01/2017 03:54 PM   Modules accepted: Orders

## 2017-04-01 NOTE — Addendum Note (Signed)
Addended by: RATCLIFFE, Nira Conn R on: 04/01/2017 11:47 AM   Modules accepted: Orders

## 2017-04-05 ENCOUNTER — Ambulatory Visit
Admission: RE | Admit: 2017-04-05 | Discharge: 2017-04-05 | Disposition: A | Discharge status: Home / Self Care | Payer: BLUE CROSS/BLUE SHIELD | Source: Ambulatory Visit | Attending: Obstetrics and Gynecology | Admitting: Obstetrics and Gynecology

## 2017-04-05 DIAGNOSIS — R922 Inconclusive mammogram: Secondary | ICD-10-CM | POA: Diagnosis not present

## 2017-04-05 DIAGNOSIS — N6452 Nipple discharge: Secondary | ICD-10-CM | POA: Diagnosis not present

## 2017-04-05 DIAGNOSIS — N6489 Other specified disorders of breast: Secondary | ICD-10-CM | POA: Diagnosis not present

## 2017-04-09 ENCOUNTER — Telehealth: Payer: Self-pay | Admitting: General Surgery

## 2017-04-09 ENCOUNTER — Telehealth: Payer: Self-pay | Admitting: Obstetrics and Gynecology

## 2017-04-09 DIAGNOSIS — N6452 Nipple discharge: Secondary | ICD-10-CM

## 2017-04-09 NOTE — Telephone Encounter (Signed)
I HAVE CALLED & L/M FOR PATIENT TO CALL AN MAKE APPOINTMENT WITH DR BYRNETT FOR NIPPLE DISCHARGE HAD MAMMO & U/S 04-05-17 CAT 2 @ [facility] REF'D BY DR Jerilynn Mages DEFRANCESCO.

## 2017-04-09 NOTE — Telephone Encounter (Signed)
The patient called and stated that she would like to know what needs to be done moving forward as far as scheduling for her Mammogram results that was recently done. No other information was disclosed, other than wanting to receive a call back. Lease advise.

## 2017-04-09 NOTE — Telephone Encounter (Signed)
Pt aware the need for breast mri and surgical consult. Both orders placed.

## 2017-04-19 ENCOUNTER — Telehealth: Payer: Self-pay | Admitting: Obstetrics and Gynecology

## 2017-04-19 NOTE — Telephone Encounter (Signed)
Patient called and stated that she needs Joyice Faster to give her a call back. No other information was disclosed. Please advise.

## 2017-04-22 NOTE — Telephone Encounter (Signed)
Pt is still not sure she needs MRI and surgical consult. Dr. Terri Piedra referral was refused until MRI was complete. Now pt is having hot flashes.Nipple d/c is minimal. Pls advise. Pt aware it may be tomorrow before you get this message.

## 2017-04-24 ENCOUNTER — Telehealth: Payer: Self-pay | Admitting: Obstetrics and Gynecology

## 2017-04-24 NOTE — Telephone Encounter (Signed)
The patient lvm stating that she would like to speak with Joyice Faster, No other information was disclosed. Please advise.   *If you could please remind the patient to leave a name and DOB when leaving vm's, Luckily I was able to pull her up by her phone pharmacy.*

## 2017-04-24 NOTE — Telephone Encounter (Signed)
Pt aware of Mad recommedations. Pt did cx MRI. She will need that r/s. I gave her the number to r/s appt with Dr. Terri Piedra.   [place]- will you please verify if her insurance will cover this? Aware she will hear from you. If you call her today pls call at work. If tomorrow please call at home. ty cm

## 2017-04-27 ENCOUNTER — Ambulatory Visit (HOSPITAL_COMMUNITY): Payer: BLUE CROSS/BLUE SHIELD

## 2017-05-01 NOTE — Addendum Note (Signed)
Addended by: Elouise Munroe on: 05/01/2017 12:34 PM   Modules accepted: Orders

## 2017-05-01 NOTE — Telephone Encounter (Signed)
Pt states she is waiting on [place] to contact her about insurance coverage. [place] to speak to pt and move forward with scheduling MRI and surgery consult.

## 2017-05-01 NOTE — Telephone Encounter (Signed)
Spoke with Patient. She is going to reschedule Breast MRI with [facility] imaging. Once she gets the results she will reschedule her appointment with [facility].

## 2017-05-02 ENCOUNTER — Telehealth: Payer: Self-pay | Admitting: *Deleted

## 2017-05-02 ENCOUNTER — Other Ambulatory Visit: Payer: Self-pay

## 2017-05-02 DIAGNOSIS — N6452 Nipple discharge: Secondary | ICD-10-CM

## 2017-05-02 NOTE — Telephone Encounter (Signed)
Patient called and states she called [facility] to schedule her MRI and she states that they do not have the order for that MRI. Patient states she would like a call back when that order is sent so she can call to schedule. Her call back number is [phone]. Please advise. Thank you

## 2017-05-02 NOTE — Telephone Encounter (Signed)
Pt aware new mri is in. Advised pt to contact [facility] imaging and schedule.

## 2017-05-07 ENCOUNTER — Other Ambulatory Visit: Payer: BLUE CROSS/BLUE SHIELD

## 2017-05-16 ENCOUNTER — Ambulatory Visit
Admission: RE | Admit: 2017-05-16 | Discharge: 2017-05-16 | Disposition: A | Discharge status: Home / Self Care | Payer: BLUE CROSS/BLUE SHIELD | Source: Ambulatory Visit | Attending: Obstetrics and Gynecology | Admitting: Obstetrics and Gynecology

## 2017-05-16 DIAGNOSIS — N6452 Nipple discharge: Secondary | ICD-10-CM

## 2017-05-16 DIAGNOSIS — N6489 Other specified disorders of breast: Secondary | ICD-10-CM | POA: Diagnosis not present

## 2017-05-16 MED ORDER — GADOBENATE DIMEGLUMINE 529 MG/ML IV SOLN
11.0000 mL | Freq: Once | INTRAVENOUS | Status: AC | PRN
  Administered 2017-05-16: 11 mL via INTRAVENOUS

## 2017-05-17 ENCOUNTER — Telehealth: Payer: Self-pay | Admitting: *Deleted

## 2017-05-17 NOTE — Telephone Encounter (Signed)
Contacted patient regarding breast MRI results:     Right breast with several small cysts but otherwise no malignancy suspected. Left breast with small cyst in posterior breast, and linear enhancement of lower inner retroareolar left breast extending just behind the nipple measuring 1.5 cm.  This is concerning for ductal carcinoma in situ.    Discussion had on the importance of f/u with General Surgery for consultation and likely biopsy of the duct.  Patient notes that she had been contacted by General Surgery, but did not want to schedule until after the results of her MRI.  Informed patient that it was now appropriate to schedule that appointment.  Will also send results and today's phone conversation to primary GYN, Dr. Hassell Done DeFrancesco for further follow up.    Rubie Maid, MD [facility]

## 2017-05-17 NOTE — Telephone Encounter (Signed)
Patient called and is inquiring about her MRI Breast results. Patient is requesting a call back.  Her contact number is [phone]. Please advise. Thank you

## 2017-05-17 NOTE — Telephone Encounter (Signed)
AC to contact pt- Mad off.

## 2017-05-20 ENCOUNTER — Telehealth: Payer: Self-pay | Admitting: Obstetrics and Gynecology

## 2017-05-20 NOTE — Telephone Encounter (Signed)
The patient called and stated that she is having a surgical consult, and needs to know when she started her estrogen, She also wants to know how she can send her MRI results to her provider, and Also wants to speak with Dr. Tennis Must.  The patient is aware that Joyice Faster and Dr. Tennis Must are out of the office. And would prefer to wait to speak with them when available, Which would be 05/23/17. Please advise.

## 2017-05-21 DIAGNOSIS — D242 Benign neoplasm of left breast: Secondary | ICD-10-CM

## 2017-05-21 HISTORY — DX: Benign neoplasm of left breast: D24.2

## 2017-05-22 NOTE — Telephone Encounter (Signed)
Per [place] she spoke with MAD today.

## 2017-05-23 ENCOUNTER — Encounter: Payer: Self-pay | Admitting: General Surgery

## 2017-05-23 ENCOUNTER — Ambulatory Visit: Payer: BLUE CROSS/BLUE SHIELD | Admitting: General Surgery

## 2017-05-23 VITALS — BP 132/68 | HR 74 | Resp 12 | Ht 65.0 in | Wt 121.0 lb

## 2017-05-23 DIAGNOSIS — N6452 Nipple discharge: Secondary | ICD-10-CM | POA: Diagnosis not present

## 2017-05-23 DIAGNOSIS — R928 Other abnormal and inconclusive findings on diagnostic imaging of breast: Secondary | ICD-10-CM | POA: Diagnosis not present

## 2017-05-23 NOTE — Progress Notes (Signed)
Patient ID: Sharon Le, female   DOB: 02/04/1956, 62 y.o.   MRN: [id number]  Chief Complaint  Patient presents with  . Other    HPI Sharon Le is a 62 y.o. female.  who presents for a breast evaluation. The most recent left mammogram was done on 04-05-17 and MRI 05-16-17. Left breast ultrasound done 04-05-17. She has noticed left nipple discharge that started October. She noticed her bra had small stains, pink tinged and clear. She does think she feels something behind the left nipple since the MRI. Last drainage was December. She states she is on estrogen and she has started having night sweats again for the past 6 months.  Patient does perform regular self breast checks and gets regular mammograms done.   Bra size A.  The patient reported debilitating hot flashes after her hysterectomy, without ovary removal, resulting in the institution of estrogen supplementation shortly thereafter.  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  HPI  Past Medical History:  Diagnosis Date  . Abnormal TSH   . Adenomatous colon polyp   . Basal cell carcinoma   . 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 Fatigue   . Kidney stones    h/o  . Osteopenia   . Retinal tear   . Rosacea   . Vasomotor instability   . Yeast infection     Past Surgical History:  Procedure Laterality Date  . BASAL CELL CARCINOMA EXCISION  abd  . COLONOSCOPY WITH PROPOFOL N/A 10/05/2015   Procedure: COLONOSCOPY WITH PROPOFOL;  Surgeon: Manya Silvas, MD;  Location: [facility] ENDOSCOPY;  Service: Endoscopy;  Laterality: N/A;  . VAGINAL HYSTERECTOMY  2010   [facility]    Family History  Problem Relation Age of Onset  . Vascular Disease Father   . Colon cancer Paternal Aunt   . Heart disease Maternal Grandmother   . Diabetes Neg Hx   . Ovarian cancer Neg Hx     Social History Social History   Tobacco Use  . Smoking status: Never Smoker  . Smokeless tobacco: Never Used  Substance Use Topics  . Alcohol use: No   . Drug use: No    No Known Allergies  Current Outpatient Medications  Medication Sig Dispense Refill  . Azelaic Acid (FINACEA EX) Apply topically.    Marland Kitchen estradiol (VIVELLE-DOT) 0.025 MG/24HR Place 1 patch onto the skin 2 (two) times a week. 24 patch 3  . olopatadine (PATANOL) 0.1 % ophthalmic solution 1 drop 2 (two) times daily.     No current facility-administered medications for this visit.     Review of Systems Review of Systems  Constitutional: Negative.   Respiratory: Negative.   Cardiovascular: Negative.     Blood pressure 132/68, pulse 74, resp. rate 12, height 5\' 5"  (1.651 m), weight 121 lb (54.9 kg), SpO2 98 %.  Physical Exam Physical Exam  Constitutional: She is oriented to person, place, and time. She appears well-developed and well-nourished.  HENT:  Mouth/Throat: Oropharynx is clear and moist.  Eyes: Conjunctivae are normal. No scleral icterus.  Neck: Neck supple.  Cardiovascular: Normal rate, regular rhythm and normal heart sounds.  Pulmonary/Chest: Breath sounds normal. Right breast exhibits no inverted nipple, no mass, no nipple discharge, no skin change and no tenderness. Left breast exhibits nipple discharge. Left breast exhibits no inverted nipple, no mass, no skin change and no tenderness.    1 central duct with clear drainage.  Lymphadenopathy:    She has no cervical  adenopathy.    She has no axillary adenopathy.  Neurological: She is alert and oriented to person, place, and time.  Skin: Skin is warm and dry.  Psychiatric: Her behavior is normal.    Data Reviewed All breast imaging studies between April 2017 and December 2018 were reviewed.  Linear band of increased density in the upper outer quadrant of the left breast on her most recent mammogram is likely related to fading breast parenchyma adjacent rather than a new dominant mass.  Ultrasound report is negative. The patient's breasts are very dense consistent with her modest breast volume (A  cup) and her ongoing hormone supplementation.  MRI reports a 1.5 cm area of enhancement in the lower inner quadrant of the left breast thought possibly to represent DCIS.  BI-RADS-4.    No other mammographic/MRI abnormalities appreciated.  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  Assessment    Nipple drainage, at times bloody by report, now resolved.  Abnormal left breast MRI.    Plan    Options for management were reviewed.  Where she is still having ongoing nipple drainage and she had not had an MRI received to resection of the involved duct and pathologic review.  Now the drainage is discontinued and she has an abnormal MRI the potential for an MRI guided biopsy comes into the pitcher.  The area encompassed on the MRI would certainly be resected at the time of removal of the retroareolar ductal structures had this been done primarily.  Patient is going to consider her options notify the office of how she would like to proceed.  There is certainly no wrong answer here.  Where she did undergo resection and DCIS identified this would likely be resected to negative margins based on the scant MRI findings of an isolated duct.    Anatomic diagrams of the ductal structures and the difference between DCIS and invasive cancer were reviewed.      Discussed surgery vs MRI biopsy, call back with her decision   HPI, Physical Exam, Assessment and Plan have been scribed under the direction and in the presence of Robert Bellow, MD. Karie Fetch, RN  I have completed the exam and reviewed the above documentation for accuracy and completeness.  I agree with the above.  Haematologist has been used and any errors in dictation or transcription are unintentional.  Hervey Ard, M.D., F.A.C.S.  Robert Bellow 05/23/2017, 7:26 PM

## 2017-05-23 NOTE — Patient Instructions (Addendum)
The patient is aware to call back for any questions or concerns. Call back with her decision

## 2017-05-24 ENCOUNTER — Telehealth: Payer: Self-pay | Admitting: Obstetrics and Gynecology

## 2017-05-24 NOTE — Telephone Encounter (Signed)
The patient called wanting to speak with Joyice Faster when available in regards to a surgical consult, Please advise.

## 2017-05-27 ENCOUNTER — Encounter: Payer: Self-pay | Admitting: Obstetrics and Gynecology

## 2017-05-27 NOTE — Telephone Encounter (Signed)
Pt has a few more questions for MAD. She would like to discuss her surgical consult. Appt made for 1/10 at 11:00.

## 2017-05-29 ENCOUNTER — Ambulatory Visit: Payer: BLUE CROSS/BLUE SHIELD | Admitting: General Surgery

## 2017-05-30 ENCOUNTER — Telehealth: Payer: Self-pay

## 2017-05-30 ENCOUNTER — Telehealth: Payer: Self-pay | Admitting: *Deleted

## 2017-05-30 ENCOUNTER — Ambulatory Visit (INDEPENDENT_AMBULATORY_CARE_PROVIDER_SITE_OTHER): Payer: BLUE CROSS/BLUE SHIELD | Admitting: Obstetrics and Gynecology

## 2017-05-30 ENCOUNTER — Encounter: Payer: Self-pay | Admitting: Obstetrics and Gynecology

## 2017-05-30 ENCOUNTER — Other Ambulatory Visit: Payer: Self-pay | Admitting: General Surgery

## 2017-05-30 VITALS — BP 116/68 | HR 66 | Ht 65.0 in | Wt 118.6 lb

## 2017-05-30 DIAGNOSIS — R928 Other abnormal and inconclusive findings on diagnostic imaging of breast: Secondary | ICD-10-CM | POA: Diagnosis not present

## 2017-05-30 DIAGNOSIS — N6452 Nipple discharge: Secondary | ICD-10-CM | POA: Diagnosis not present

## 2017-05-30 DIAGNOSIS — Z85828 Personal history of other malignant neoplasm of skin: Secondary | ICD-10-CM | POA: Diagnosis not present

## 2017-05-30 NOTE — Telephone Encounter (Signed)
Left message on work and home numbers for patient to call the office.   We need to get surgery arranged at patient's convenience.

## 2017-05-30 NOTE — Telephone Encounter (Signed)
Patient's surgery has been scheduled for Friday, 06-07-17 at [facility].   Surgery instructions were reviewed with the patient by phone today.   The patient is aware to call the office if she has further questions. She verbalizes understanding.

## 2017-05-30 NOTE — Progress Notes (Signed)
The patient called back reporting that she desired to proceed with surgical excision of the area.  I contacted her by phone and we reviewed our options: 1) MRI guided biopsy versus 2 excision of the retroareolar ductal structures including the abnormal area identified by MRI within the 1.5 cm radius of the nipple.  Pros and cons of each were reviewed as well as the anticipated return to work in about 3 days with excision.

## 2017-05-30 NOTE — Telephone Encounter (Signed)
Patient called and she has decided she would like to proceed with breast surgery. She did confirm that she had a maternal aunt with breast cancer diagnosed in her mid 25s.  I let her know I would notify Dr Bary Castilla and we would be in contact with her to set up a date for this.

## 2017-06-01 ENCOUNTER — Other Ambulatory Visit: Payer: Self-pay | Admitting: Obstetrics and Gynecology

## 2017-06-03 ENCOUNTER — Encounter: Payer: Self-pay | Admitting: *Deleted

## 2017-06-03 ENCOUNTER — Encounter
Admission: RE | Admit: 2017-06-03 | Discharge: 2017-06-03 | Disposition: A | Discharge status: Home / Self Care | Payer: BLUE CROSS/BLUE SHIELD | Source: Ambulatory Visit | Attending: General Surgery | Admitting: General Surgery

## 2017-06-03 ENCOUNTER — Other Ambulatory Visit: Payer: Self-pay

## 2017-06-03 HISTORY — DX: Personal history of urinary calculi: Z87.442

## 2017-06-03 HISTORY — DX: Nonrheumatic mitral (valve) prolapse: I34.1

## 2017-06-03 HISTORY — DX: Other complications of anesthesia, initial encounter: T88.59XA

## 2017-06-03 HISTORY — DX: Other specified abnormal findings of blood chemistry: R79.89

## 2017-06-03 HISTORY — DX: Adverse effect of unspecified anesthetic, initial encounter: T41.45XA

## 2017-06-03 HISTORY — DX: Anemia, unspecified: D64.9

## 2017-06-03 HISTORY — DX: Family history of other specified conditions: Z84.89

## 2017-06-03 NOTE — Progress Notes (Signed)
Patient had emailed Sharon Le with a few questions and concerns regarding surgery scheduled for Friday, 06-07-17 at [facility].   I did call and speak with the patient this morning to address her concerns.   Patient was concerned that [place] would not cover surgery if we did not have an authorization. She states her recent breast MRI was denied and she is having to do an appeal. Patient states she does not want to have to pay for surgery out of pocket nor can she afford too. The patient was notified that per [facility] the outpatient surgery that she is scheduled for does not require prior authorization for CPT code 19120. Also notified that this does not mean that BCBS will cover her surgery at 100% and that deductibles and coinsurance will apply. She is aware if she has questions regarding her benefits to call the customer service number on the back of her insurance card.  Patient is concerned of her low fibrinogen and that she tends to bleed. Dr. Bary Castilla is aware. I have also notified [name] in the Pre-admission Testing Department about this as well as the fact she had a hard time waking up with her [facility] in 2010 with Dr. Keturah Barre. Patient instructed to remind them of this when they call to do her phone interview later this afternoon, as well as day of surgery.   Patient states she is very independent and wants to know what post incision care will be expected of her after surgery. The patient was provided with a copy of office breast biopsy care instructions. She is aware that Dr. Bary Castilla will provide her post op care instructions the day of surgery on discharge summary. Patient is aware that these instructions may change based upon Dr. Dwyane Luo recommendations after surgery.   Patient verbalizes understanding.

## 2017-06-03 NOTE — Patient Instructions (Signed)
  Your procedure is scheduled on: 06-07-16 FRIDAY Report to Same Day Surgery 2nd floor medical mall [facility] Entrance-take elevator on left to 2nd floor.  Check in with surgery information desk.) To find out your arrival time please call [phone] between 1PM - 3PM on 06-06-16 THURSDAY  Remember: Instructions that are not followed completely may result in serious medical risk, up to and including death, or upon the discretion of your surgeon and anesthesiologist your surgery may need to be rescheduled.    _x___ 1. Do not eat food after midnight the night before your procedure. NO GUM OR CANDY AFTER MIDNIGHT.  You may drink clear liquids up to 2 hours before you are scheduled to arrive at the hospital for your procedure.  Do not drink clear liquids within 2 hours of your scheduled arrival to the hospital.  Clear liquids include  --Water or Apple juice without pulp  --Clear carbohydrate beverage such as ClearFast or Gatorade  --Black Coffee or Clear Tea (No milk, no creamers, do not add anything to the coffee or Tea     __x__ 2. No Alcohol for 24 hours before or after surgery.   __x__3. No Smoking for 24 prior to surgery.   ____  4. Bring all medications with you on the day of surgery if instructed.    __x__ 5. Notify your doctor if there is any change in your medical condition     (cold, fever, infections).     Do not wear jewelry, make-up, hairpins, clips or nail polish.  Do not wear lotions, powders, or perfumes. You may wear deodorant.  Do not shave 48 hours prior to surgery. Men may shave face and neck.  Do not bring valuables to the hospital.    [facility] is not responsible for any belongings or valuables.               Contacts, dentures or bridgework may not be worn into surgery.  Leave your suitcase in the car. After surgery it may be brought to your room.  For patients admitted to the hospital, discharge time is determined by your treatment team.   Patients  discharged the day of surgery will not be allowed to drive home.  You will need someone to drive you home and stay with you the night of your procedure.    Please read over the following fact sheets that you were given:   [facility] Preparing for Surgery and or MRSA Information   ____ TAKE THE FOLLOWING MEDICATION THE MORNING OF SURGERY WITH A SMALL SIP OF WATER. These include:  1. NONE  2.  3.  4.  5.  6.  ____Fleets enema or Magnesium Citrate as directed.   _x___ Use CHG Soap or sage wipes as directed on instruction sheet   ____ Use inhalers on the day of surgery and bring to hospital day of surgery  ____ Stop Metformin and Janumet 2 days prior to surgery.    ____ Take 1/2 of usual insulin dose the night before surgery and none on the morning surgery.   ____ Follow recommendations from Cardiologist, Pulmonologist or PCP regarding stopping Aspirin, Coumadin, Plavix ,Eliquis, Effient, or Pradaxa, and Pletal.  ____Stop Anti-inflammatories such as Advil, Aleve, Ibuprofen, Motrin, Naproxen, Naprosyn, Goodies powders or aspirin products -OK to take Tylenol .   ____ Stop supplements until after surgery.    ____ Bring C-Pap to the hospital.

## 2017-06-04 ENCOUNTER — Encounter
Admission: RE | Admit: 2017-06-04 | Discharge: 2017-06-04 | Disposition: A | Discharge status: Home / Self Care | Payer: BLUE CROSS/BLUE SHIELD | Source: Ambulatory Visit | Attending: General Surgery | Admitting: General Surgery

## 2017-06-04 ENCOUNTER — Telehealth: Payer: Self-pay | Admitting: *Deleted

## 2017-06-04 DIAGNOSIS — Z86010 Personal history of colonic polyps: Secondary | ICD-10-CM | POA: Diagnosis not present

## 2017-06-04 DIAGNOSIS — Z7982 Long term (current) use of aspirin: Secondary | ICD-10-CM | POA: Diagnosis not present

## 2017-06-04 DIAGNOSIS — Z90710 Acquired absence of both cervix and uterus: Secondary | ICD-10-CM | POA: Diagnosis not present

## 2017-06-04 DIAGNOSIS — L719 Rosacea, unspecified: Secondary | ICD-10-CM | POA: Diagnosis not present

## 2017-06-04 DIAGNOSIS — D688 Other specified coagulation defects: Secondary | ICD-10-CM | POA: Diagnosis not present

## 2017-06-04 DIAGNOSIS — D649 Anemia, unspecified: Secondary | ICD-10-CM | POA: Diagnosis not present

## 2017-06-04 DIAGNOSIS — R55 Syncope and collapse: Secondary | ICD-10-CM | POA: Diagnosis not present

## 2017-06-04 DIAGNOSIS — N6452 Nipple discharge: Secondary | ICD-10-CM | POA: Diagnosis present

## 2017-06-04 DIAGNOSIS — Z79899 Other long term (current) drug therapy: Secondary | ICD-10-CM | POA: Diagnosis not present

## 2017-06-04 DIAGNOSIS — M8580 Other specified disorders of bone density and structure, unspecified site: Secondary | ICD-10-CM | POA: Diagnosis not present

## 2017-06-04 DIAGNOSIS — Z87442 Personal history of urinary calculi: Secondary | ICD-10-CM | POA: Diagnosis not present

## 2017-06-04 DIAGNOSIS — R928 Other abnormal and inconclusive findings on diagnostic imaging of breast: Secondary | ICD-10-CM | POA: Insufficient documentation

## 2017-06-04 DIAGNOSIS — D242 Benign neoplasm of left breast: Secondary | ICD-10-CM | POA: Diagnosis not present

## 2017-06-04 DIAGNOSIS — Z85828 Personal history of other malignant neoplasm of skin: Secondary | ICD-10-CM | POA: Diagnosis not present

## 2017-06-04 DIAGNOSIS — I341 Nonrheumatic mitral (valve) prolapse: Secondary | ICD-10-CM | POA: Diagnosis not present

## 2017-06-04 LAB — PLATELET FUNCTION ASSAY: COLLAGEN / EPINEPHRINE: 137 s (ref 0–193)

## 2017-06-04 LAB — CBC
HEMATOCRIT: 39.3 % (ref 35.0–47.0)
Hemoglobin: 13.1 g/dL (ref 12.0–16.0)
MCH: 31.9 pg (ref 26.0–34.0)
MCHC: 33.4 g/dL (ref 32.0–36.0)
MCV: 95.5 fL (ref 80.0–100.0)
Platelets: 181 10*3/uL (ref 150–440)
RBC: 4.11 MIL/uL (ref 3.80–5.20)
RDW: 12.4 % (ref 11.5–14.5)
WBC: 4 10*3/uL (ref 3.6–11.0)

## 2017-06-04 LAB — PROTIME-INR
INR: 0.99
PROTHROMBIN TIME: 13 s (ref 11.4–15.2)

## 2017-06-04 LAB — APTT: aPTT: 35 seconds (ref 24–36)

## 2017-06-04 LAB — FIBRINOGEN: Fibrinogen: 289 mg/dL (ref 210–475)

## 2017-06-04 NOTE — Patient Instructions (Signed)
1.  Follow-up with Dr. Tollie Pizza for further management of nipple discharge as discussed

## 2017-06-04 NOTE — Progress Notes (Signed)
Chief complaint: 1.  Left nipple discharge 2.  Abnormal findings on breast MRI  CLINICAL DATA:  Spontaneous single duct nipple discharge since October 2018. Initially the discharge was brownish in color and has subsequently become clear. No corresponding abnormality at recent mammography and ultrasound.  LABS:  Creatinine was obtained on site at [facility] at [address]  Results: Creatinine 0.8 mg/dL.  EXAM: BILATERAL BREAST MRI WITH AND WITHOUT CONTRAST  TECHNIQUE: Multiplanar, multisequence MR images of both breasts were obtained prior to and following the intravenous administration of 11 ml of MultiHance.  THREE-DIMENSIONAL MR IMAGE RENDERING ON INDEPENDENT WORKSTATION:  Three-dimensional MR images were rendered by post-processing of the original MR data on an independent workstation. The three-dimensional MR images were interpreted, and findings are reported in the following complete MRI report for this study. Three dimensional images were evaluated at the independent DynaCad workstation  COMPARISON:  Previous mammogram and ultrasound examinations.  FINDINGS: Breast composition: c. Heterogeneous fibroglandular tissue.  Background parenchymal enhancement: Mild background nodular parenchymal enhancement in both breasts.  Right breast: Several small cysts. No mass or enhancement suspicious for malignancy.  Left breast: Small cyst in the posterior central portion of the breast slightly laterally. There is also a small area of linear enhancement in the the lower inner retroareolar left breast extending just behind the nipple. This measures 1.5 cm in length on images 80 through 86 of series 9. This has persistent enhancement kinetics.  Lymph nodes: No abnormal appearing lymph nodes.  Ancillary findings:  None.  IMPRESSION: 1. 1.5 cm long linear area of ductal enhancement in the lower inner retroareolar left breast. This is  concerning for ductal carcinoma in situ. 2. Small bilateral benign breast cysts.  RECOMMENDATION: MR guided core needle biopsy of the 1.5 cm linear area of ductal enhancement in the lower inner retroareolar left breast.  BI-RADS CATEGORY  4: Suspicious.   Electronically Signed   By: Claudie Revering M.D.   On: 05/16/2017 15:44  CONFERENCE NOTE: Sharon Le presents today for conference to discuss the findings on her follow-up MRI done for workup of a left nipple discharge.  She states that the discharge has come back after not seeing it for several weeks. Patient did see Dr. Jeannette Corpus where management options were reviewed. Patient had multiple questions following her evaluation with Dr. Marlyn Corporal and we addressed these today. Patient has decided that she does want the abnormal ductal tissue associated with the MRI findings to be excised.  She is very comfortable with Dr. Tollie Pizza and will contact him for follow-up management.  ASSESSMENT: 1.  Left nipple discharge 2.  Abnormal MRI of breast 3.  Patient desires excisional biopsy  PLAN: 1.  Follow-up with Dr. Tollie Pizza as planned to have the breast abnormality excised  A total of 15 minutes were spent face-to-face with the patient during this encounter and over half of that time dealt with counseling and coordination of care.  Brayton Mars, MD  Note: This dictation was prepared with Dragon dictation along with smaller phrase technology. Any transcriptional errors that result from this process are unintentional.

## 2017-06-04 NOTE — Telephone Encounter (Signed)
Pt plans on returning to work 06-12-17.

## 2017-06-06 ENCOUNTER — Encounter: Payer: Self-pay | Admitting: *Deleted

## 2017-06-06 NOTE — Pre-Procedure Instructions (Signed)
Spoke with Dr Marcello Moores on 06-03-17 to inform him that pt has had a decreased fibrinogen in the past and had trouble with bleeding after wisdom teeth removal.  Pt has never seen a hematologist.  Dr Marcello Moores wants a CBC, PT/PTT, Bleeding time, and fibrinogen ordered.  Pt to come in on 06-04-17 for all these labs

## 2017-06-07 ENCOUNTER — Ambulatory Visit: Payer: BLUE CROSS/BLUE SHIELD | Admitting: Anesthesiology

## 2017-06-07 ENCOUNTER — Other Ambulatory Visit: Payer: Self-pay

## 2017-06-07 ENCOUNTER — Inpatient Hospital Stay: Admission: RE | Admit: 2017-06-07 | Payer: Self-pay

## 2017-06-07 ENCOUNTER — Encounter
Admission: RE | Disposition: A | Discharge status: Home / Self Care | Payer: Self-pay | Source: Ambulatory Visit | Attending: General Surgery

## 2017-06-07 ENCOUNTER — Ambulatory Visit
Admission: RE | Admit: 2017-06-07 | Discharge: 2017-06-07 | Disposition: A | Discharge status: Home / Self Care | Payer: BLUE CROSS/BLUE SHIELD | Source: Ambulatory Visit | Attending: General Surgery | Admitting: General Surgery

## 2017-06-07 ENCOUNTER — Encounter: Payer: Self-pay | Admitting: *Deleted

## 2017-06-07 DIAGNOSIS — M8580 Other specified disorders of bone density and structure, unspecified site: Secondary | ICD-10-CM | POA: Diagnosis not present

## 2017-06-07 DIAGNOSIS — D242 Benign neoplasm of left breast: Secondary | ICD-10-CM | POA: Diagnosis not present

## 2017-06-07 DIAGNOSIS — R55 Syncope and collapse: Secondary | ICD-10-CM | POA: Insufficient documentation

## 2017-06-07 DIAGNOSIS — Z7982 Long term (current) use of aspirin: Secondary | ICD-10-CM | POA: Insufficient documentation

## 2017-06-07 DIAGNOSIS — Z85828 Personal history of other malignant neoplasm of skin: Secondary | ICD-10-CM | POA: Insufficient documentation

## 2017-06-07 DIAGNOSIS — Z90710 Acquired absence of both cervix and uterus: Secondary | ICD-10-CM | POA: Insufficient documentation

## 2017-06-07 DIAGNOSIS — Z86010 Personal history of colonic polyps: Secondary | ICD-10-CM | POA: Insufficient documentation

## 2017-06-07 DIAGNOSIS — Z87442 Personal history of urinary calculi: Secondary | ICD-10-CM | POA: Insufficient documentation

## 2017-06-07 DIAGNOSIS — L719 Rosacea, unspecified: Secondary | ICD-10-CM | POA: Diagnosis not present

## 2017-06-07 DIAGNOSIS — Z79899 Other long term (current) drug therapy: Secondary | ICD-10-CM | POA: Insufficient documentation

## 2017-06-07 DIAGNOSIS — D649 Anemia, unspecified: Secondary | ICD-10-CM | POA: Insufficient documentation

## 2017-06-07 DIAGNOSIS — I341 Nonrheumatic mitral (valve) prolapse: Secondary | ICD-10-CM | POA: Insufficient documentation

## 2017-06-07 DIAGNOSIS — D688 Other specified coagulation defects: Secondary | ICD-10-CM | POA: Diagnosis not present

## 2017-06-07 DIAGNOSIS — D493 Neoplasm of unspecified behavior of breast: Secondary | ICD-10-CM | POA: Diagnosis not present

## 2017-06-07 DIAGNOSIS — N6489 Other specified disorders of breast: Secondary | ICD-10-CM | POA: Diagnosis not present

## 2017-06-07 DIAGNOSIS — N6452 Nipple discharge: Secondary | ICD-10-CM | POA: Diagnosis not present

## 2017-06-07 DIAGNOSIS — N6012 Diffuse cystic mastopathy of left breast: Secondary | ICD-10-CM | POA: Diagnosis not present

## 2017-06-07 HISTORY — PX: BREAST DUCTAL SYSTEM EXCISION: SHX5242

## 2017-06-07 HISTORY — PX: BREAST EXCISIONAL BIOPSY: SUR124

## 2017-06-07 SURGERY — EXCISION DUCTAL SYSTEM BREAST
Anesthesia: General | Laterality: Left | Wound class: Clean

## 2017-06-07 MED ORDER — BUPIVACAINE HCL 0.5 % IJ SOLN
INTRAMUSCULAR | Status: DC | PRN
  Administered 2017-06-07: 30 mL

## 2017-06-07 MED ORDER — TRAMADOL HCL 50 MG PO TABS: 50.0000 mg | ORAL_TABLET | ORAL | 0 refills | Status: DC | PRN

## 2017-06-07 MED ORDER — GABAPENTIN 300 MG PO CAPS
300.0000 mg | ORAL_CAPSULE | ORAL | Status: AC
  Administered 2017-06-07: 300 mg via ORAL

## 2017-06-07 MED ORDER — FENTANYL CITRATE (PF) 100 MCG/2ML IJ SOLN
INTRAMUSCULAR | Status: AC
  Filled 2017-06-07: qty 2

## 2017-06-07 MED ORDER — FENTANYL CITRATE (PF) 100 MCG/2ML IJ SOLN: 25.0000 ug | INTRAMUSCULAR | Status: DC | PRN

## 2017-06-07 MED ORDER — PHENYLEPHRINE HCL 10 MG/ML IJ SOLN
INTRAMUSCULAR | Status: DC | PRN
  Administered 2017-06-07: 50 ug via INTRAVENOUS

## 2017-06-07 MED ORDER — FAMOTIDINE 20 MG PO TABS
20.0000 mg | ORAL_TABLET | Freq: Once | ORAL | Status: AC
  Administered 2017-06-07: 20 mg via ORAL

## 2017-06-07 MED ORDER — PROPOFOL 10 MG/ML IV BOLUS
INTRAVENOUS | Status: DC | PRN
  Administered 2017-06-07: 50 mg via INTRAVENOUS
  Administered 2017-06-07: 100 mg via INTRAVENOUS

## 2017-06-07 MED ORDER — GLYCOPYRROLATE 0.2 MG/ML IJ SOLN
INTRAMUSCULAR | Status: DC | PRN
  Administered 2017-06-07: 0.2 mg via INTRAVENOUS

## 2017-06-07 MED ORDER — BUPIVACAINE-EPINEPHRINE (PF) 0.5% -1:200000 IJ SOLN
INTRAMUSCULAR | Status: AC
  Filled 2017-06-07: qty 30

## 2017-06-07 MED ORDER — ACETAMINOPHEN 500 MG PO TABS
1000.0000 mg | ORAL_TABLET | ORAL | Status: AC
  Administered 2017-06-07: 1000 mg via ORAL

## 2017-06-07 MED ORDER — FAMOTIDINE 20 MG PO TABS
ORAL_TABLET | ORAL | Status: AC
  Administered 2017-06-07: 20 mg via ORAL
  Filled 2017-06-07: qty 1

## 2017-06-07 MED ORDER — FENTANYL CITRATE (PF) 100 MCG/2ML IJ SOLN
INTRAMUSCULAR | Status: DC | PRN
  Administered 2017-06-07: 25 ug via INTRAVENOUS

## 2017-06-07 MED ORDER — CELECOXIB 200 MG PO CAPS
200.0000 mg | ORAL_CAPSULE | ORAL | Status: AC
  Administered 2017-06-07: 200 mg via ORAL

## 2017-06-07 MED ORDER — ONDANSETRON HCL 4 MG/2ML IJ SOLN
INTRAMUSCULAR | Status: DC | PRN
  Administered 2017-06-07: 4 mg via INTRAVENOUS

## 2017-06-07 MED ORDER — DEXAMETHASONE SODIUM PHOSPHATE 10 MG/ML IJ SOLN
INTRAMUSCULAR | Status: DC | PRN
  Administered 2017-06-07: 5 mg via INTRAVENOUS

## 2017-06-07 MED ORDER — LACTATED RINGERS IV SOLN
INTRAVENOUS | Status: DC
  Administered 2017-06-07 (×2): via INTRAVENOUS

## 2017-06-07 MED ORDER — CELECOXIB 200 MG PO CAPS
ORAL_CAPSULE | ORAL | Status: AC
  Administered 2017-06-07: 200 mg via ORAL
  Filled 2017-06-07: qty 1

## 2017-06-07 MED ORDER — PROPOFOL 10 MG/ML IV BOLUS
INTRAVENOUS | Status: AC
  Filled 2017-06-07: qty 60

## 2017-06-07 MED ORDER — ACETAMINOPHEN 500 MG PO TABS
ORAL_TABLET | ORAL | Status: AC
  Administered 2017-06-07: 1000 mg via ORAL
  Filled 2017-06-07: qty 2

## 2017-06-07 MED ORDER — GABAPENTIN 300 MG PO CAPS
ORAL_CAPSULE | ORAL | Status: AC
  Administered 2017-06-07: 300 mg via ORAL
  Filled 2017-06-07: qty 1

## 2017-06-07 MED ORDER — BUPIVACAINE HCL (PF) 0.5 % IJ SOLN
INTRAMUSCULAR | Status: AC
Start: 2017-06-07 — End: 2017-06-07
  Filled 2017-06-07: qty 30

## 2017-06-07 MED ORDER — ONDANSETRON HCL 4 MG/2ML IJ SOLN: 4.0000 mg | Freq: Once | INTRAMUSCULAR | Status: DC | PRN

## 2017-06-07 MED ORDER — LIDOCAINE HCL (CARDIAC) 20 MG/ML IV SOLN
INTRAVENOUS | Status: DC | PRN
  Administered 2017-06-07: 60 mg via INTRAVENOUS

## 2017-06-07 SURGICAL SUPPLY — 49 items
BINDER BREAST MEDIUM (GAUZE/BANDAGES/DRESSINGS) ×2 IMPLANT
BLADE SURG 15 STRL SS SAFETY (BLADE) ×4 IMPLANT
BRA SURGICAL LRG (MISCELLANEOUS) IMPLANT
BRA SURGICAL XLRG (MISCELLANEOUS) IMPLANT
BULB RESERV EVAC DRAIN JP 100C (MISCELLANEOUS) IMPLANT
CANISTER SUCT 1200ML W/VALVE (MISCELLANEOUS) ×2 IMPLANT
CHLORAPREP W/TINT 26ML (MISCELLANEOUS) ×2 IMPLANT
CLIP TI WIDE RED SMALL 6 (CLIP) ×2 IMPLANT
CNTNR SPEC 2.5X3XGRAD LEK (MISCELLANEOUS) ×1
CONT SPEC 4OZ STER OR WHT (MISCELLANEOUS) ×1
CONTAINER SPEC 2.5X3XGRAD LEK (MISCELLANEOUS) ×1 IMPLANT
COVER PROBE FLX POLY STRL (MISCELLANEOUS) IMPLANT
DRAIN CHANNEL JP 15F RND 16 (MISCELLANEOUS) IMPLANT
DRAPE LAPAROTOMY TRNSV 106X77 (MISCELLANEOUS) ×2 IMPLANT
DRSG TELFA 3X8 NADH (GAUZE/BANDAGES/DRESSINGS) ×4 IMPLANT
ELECT CAUTERY BLADE TIP 2.5 (TIP) ×2
ELECT REM PT RETURN 9FT ADLT (ELECTROSURGICAL) ×2
ELECTRODE CAUTERY BLDE TIP 2.5 (TIP) ×1 IMPLANT
ELECTRODE REM PT RTRN 9FT ADLT (ELECTROSURGICAL) ×1 IMPLANT
GAUZE FLUFF 18X24 1PLY STRL (GAUZE/BANDAGES/DRESSINGS) ×2 IMPLANT
GLOVE BIO SURGEON STRL SZ7.5 (GLOVE) ×4 IMPLANT
GLOVE INDICATOR 8.0 STRL GRN (GLOVE) ×4 IMPLANT
GOWN STRL REUS W/ TWL LRG LVL3 (GOWN DISPOSABLE) ×2 IMPLANT
GOWN STRL REUS W/TWL LRG LVL3 (GOWN DISPOSABLE) ×2
KIT RM TURNOVER STRD PROC AR (KITS) ×2 IMPLANT
LABEL OR SOLS (LABEL) ×2 IMPLANT
MARGIN MAP 10MM (MISCELLANEOUS) ×2 IMPLANT
NEEDLE HYPO 22GX1.5 SAFETY (NEEDLE) ×2 IMPLANT
NEEDLE HYPO 25X1 1.5 SAFETY (NEEDLE) IMPLANT
PACK BASIN MINOR ARMC (MISCELLANEOUS) ×2 IMPLANT
SHEARS FOC LG CVD HARMONIC 17C (MISCELLANEOUS) IMPLANT
SHEARS HARMONIC 9CM CVD (BLADE) IMPLANT
STRIP CLOSURE SKIN 1/2X4 (GAUZE/BANDAGES/DRESSINGS) ×2 IMPLANT
SUT ETHILON 3-0 FS-10 30 BLK (SUTURE) ×2
SUT SILK 2 0 (SUTURE) ×1
SUT SILK 2-0 18XBRD TIE 12 (SUTURE) ×1 IMPLANT
SUT VIC AB 2-0 CT1 27 (SUTURE) ×1
SUT VIC AB 2-0 CT1 TAPERPNT 27 (SUTURE) ×1 IMPLANT
SUT VIC AB 3-0 SH 27 (SUTURE) ×1
SUT VIC AB 3-0 SH 27X BRD (SUTURE) ×1 IMPLANT
SUT VIC AB 4-0 FS2 27 (SUTURE) ×2 IMPLANT
SUT VICRYL+ 3-0 144IN (SUTURE) ×2 IMPLANT
SUTURE EHLN 3-0 FS-10 30 BLK (SUTURE) ×1 IMPLANT
SWABSTK COMLB BENZOIN TINCTURE (MISCELLANEOUS) ×2 IMPLANT
SYR 10ML LL (SYRINGE) IMPLANT
SYR BULB IRRIG 60ML STRL (SYRINGE) ×2 IMPLANT
SYR CONTROL 10ML (SYRINGE) ×2 IMPLANT
TAPE TRANSPORE STRL 2 31045 (GAUZE/BANDAGES/DRESSINGS) IMPLANT
WATER STERILE IRR 1000ML POUR (IV SOLUTION) ×2 IMPLANT

## 2017-06-07 NOTE — Anesthesia Preprocedure Evaluation (Signed)
Anesthesia Evaluation  Patient identified by MRN, date of birth, ID band Patient awake    Reviewed: Allergy & Precautions, NPO status , Patient's Chart, lab work & pertinent test results, reviewed documented beta blocker date and time   History of Anesthesia Complications (+) Family history of anesthesia reaction and history of anesthetic complications  Airway Mallampati: II  TM Distance: >3 FB     Dental  (+) Chipped   Pulmonary           Cardiovascular      Neuro/Psych    GI/Hepatic   Endo/Other    Renal/GU      Musculoskeletal   Abdominal   Peds  Hematology  (+) anemia ,   Anesthesia Other Findings   Reproductive/Obstetrics                             Anesthesia Physical Anesthesia Plan  ASA: II  Anesthesia Plan: General   Post-op Pain Management:    Induction: Intravenous  PONV Risk Score and Plan:   Airway Management Planned: LMA  Additional Equipment:   Intra-op Plan:   Post-operative Plan:   Informed Consent: I have reviewed the patients History and Physical, chart, labs and discussed the procedure including the risks, benefits and alternatives for the proposed anesthesia with the patient or authorized representative who has indicated his/her understanding and acceptance.     Plan Discussed with: CRNA  Anesthesia Plan Comments:         Anesthesia Quick Evaluation

## 2017-06-07 NOTE — H&P (Addendum)
Sharon Le [id number] December 17, 1955     HPI: Healthy 62 year old with chronic left nipple drainage and abnormal MRI.  For ductal excision.  Medications Prior to Admission  Medication Sig Dispense Refill Last Dose  . aspirin-acetaminophen-caffeine (EXCEDRIN MIGRAINE) 250-250-65 MG tablet Take 1 tablet by mouth every 6 (six) hours as needed for headache.   Past Month at Unknown time  . Azelaic Acid (FINACEA) 15 % cream Apply 1 application topically daily.    06/06/2017 at Unknown time  . estradiol (VIVELLE-DOT) 0.025 MG/24HR PLACE 1 PATCH ONTO SKIN 2 TIMES A WEEK 24 patch 0 06/06/2017 at Unknown time  . olopatadine (PATANOL) 0.1 % ophthalmic solution Place 1 drop into both eyes 2 (two) times daily as needed for allergies (seasonal).    summer   No Known Allergies Past Medical History:  Diagnosis Date  . Abnormal TSH   . Adenomatous colon polyp   . Anemia   . Basal cell carcinoma   . Complication of anesthesia    HARD TIME WAKING UP AFTER HYSTERECTOMY  . Decreased fibrinogen ([place])   . Elevated TSH   . Family history of adverse reaction to anesthesia    PT UNSURE  . Fatigue   . History of kidney stones   . MVP (mitral valve prolapse)    DX AT AGE 66-PT ASYMPTOMATIC AND HAS NEVER SEEN CARDIOLOGIST OR HAD ECHO DONE  . Osteopenia   . Retinal tear   . Rosacea   . Vasomotor instability   . Yeast infection    Past Surgical History:  Procedure Laterality Date  . BASAL CELL CARCINOMA EXCISION  abd  . COLONOSCOPY WITH PROPOFOL N/A 10/05/2015   Procedure: COLONOSCOPY WITH PROPOFOL;  Surgeon: Manya Silvas, MD;  Location: [facility] ENDOSCOPY;  Service: Endoscopy;  Laterality: N/A;  . VAGINAL HYSTERECTOMY  2010   [facility]  . WISDOM TOOTH EXTRACTION     Social History   Socioeconomic History  . Marital status: Single    Spouse name: Not on file  . Number of children: Not on file  . Years of education: Not on file  . Highest education level: Not on file  Social Needs  . Financial resource  strain: Not on file  . Food insecurity - worry: Not on file  . Food insecurity - inability: Not on file  . Transportation needs - medical: Not on file  . Transportation needs - non-medical: Not on file  Occupational History  . Not on file  Tobacco Use  . Smoking status: Never Smoker  . Smokeless tobacco: Never Used  Substance and Sexual Activity  . Alcohol use: No  . Drug use: No  . Sexual activity: Not Currently  Other Topics Concern  . Not on file  Social History Narrative  . Not on file   Social History   Social History Narrative  . Not on file     ROS: Negative.     PE: HEENT: Negative. Lungs: Clear. Cardio: RR.  Assessment/Plan:  Proceed with planned  excision of left retroareolar ductal tissue.   Sharon Le [facility] 06/07/2017

## 2017-06-07 NOTE — Transfer of Care (Signed)
Immediate Anesthesia Transfer of Care Note  Patient: Sharon Le  Procedure(s) Performed: EXCISION DUCTAL SYSTEM BREAST (Left )  Patient Location: PACU  Anesthesia Type:General  Level of Consciousness: awake, alert , oriented and patient cooperative  Airway & Oxygen Therapy: Patient Spontanous Breathing  Post-op Assessment: Report given to RN, Post -op Vital signs reviewed and stable and Patient moving all extremities  Post vital signs: Reviewed and stable  Last Vitals:  Vitals:   06/07/17 0612 06/07/17 0836  BP: 137/62 129/77  Pulse: 65 81  Resp: 18 17  Temp: (!) 35.1 C (!) 35.9 C  SpO2: 100% 100%    Last Pain:  Vitals:   06/07/17 0836  TempSrc:   PainSc: 2          Complications: No apparent anesthesia complications

## 2017-06-07 NOTE — Anesthesia Postprocedure Evaluation (Signed)
Anesthesia Post Note  Patient: Sharon Le  Procedure(s) Performed: EXCISION DUCTAL SYSTEM BREAST (Left )  Patient location during evaluation: PACU Anesthesia Type: General Level of consciousness: awake and alert Pain management: pain level controlled Vital Signs Assessment: post-procedure vital signs reviewed and stable Respiratory status: spontaneous breathing, nonlabored ventilation, respiratory function stable and patient connected to nasal cannula oxygen Cardiovascular status: blood pressure returned to baseline and stable Postop Assessment: no apparent nausea or vomiting Anesthetic complications: no     Last Vitals:  Vitals:   06/07/17 0933 06/07/17 1016  BP: (!) 142/69 138/62  Pulse: (!) 57 (!) 54  Resp: 14 14  Temp: (!) 36.3 C (!) 36.1 C  SpO2: 100% 100%    Last Pain:  Vitals:   06/07/17 1016  TempSrc: Temporal  PainSc: [place]

## 2017-06-07 NOTE — Anesthesia Post-op Follow-up Note (Signed)
Anesthesia QCDR form completed.        

## 2017-06-07 NOTE — OR Nursing (Signed)
Dr. Bary Castilla in to see pt in postop earlier.

## 2017-06-07 NOTE — Anesthesia Procedure Notes (Signed)
Procedure Name: LMA Insertion Date/Time: 06/07/2017 7:37 AM Performed by: Lowry Bowl, CRNA Pre-anesthesia Checklist: Patient identified, Emergency Drugs available, Suction available and Patient being monitored Patient Re-evaluated:Patient Re-evaluated prior to induction Oxygen Delivery Method: Circle system utilized Preoxygenation: Pre-oxygenation with 100% oxygen Induction Type: IV induction Ventilation: Mask ventilation without difficulty LMA: LMA inserted LMA Size: 4.0 Number of attempts: 2 Placement Confirmation: positive ETCO2 and breath sounds checked- equal and bilateral Tube secured with: Tape Dental Injury: Teeth and Oropharynx as per pre-operative assessment

## 2017-06-07 NOTE — Discharge Instructions (Signed)

## 2017-06-07 NOTE — Op Note (Signed)
Preoperative diagnosis: Chronic left nipple drainage, abnormal MRI.  Postoperative diagnosis: Same.  Operative procedure: Excision of left breast retroareolar ductal structures.  Operating Surgeon: Hervey Ard, MD.  Anesthesia: General by LMA, Marcaine 0.5%, plain, 30 cc.  Estimated blood loss: Less than 5 cc.  Clinical note: This 62 year old woman developed spontaneous drainage from the left nipple.  Mammography and ultrasound were negative.  An MRI suggested a 1.5 cm band of abnormal tissue in the retroareolar area extending from the base of the nipple suspicious for DCIS.  Given her options for management with either MRI guided biopsy or excision of the retroareolar ductal structures the patient chose the latter.  Operative note: With the patient under adequate general anesthesia the breast was prepped with ChloraPrep and draped.  Marcaine was infiltrated around the periphery of the surgical incision for postoperative analgesia.  The draining duct in the central portion of the nipple was cannulated with a lacrimal duct probe.  An circumareolar incision from the 3-9 o'clock position was made carried down through skin subtendinous tissue.  Hemostasis was electrocautery.  The areolar tissue was elevated off the underlying adipose tissue and ductal structures circumferentially.  The ducts were isolated and divided at the base of the nipple with a 3-0 Vicryl tie.  Eighth skin marker for orientation was placed with a 3-0 nylon stitch on the ductal structures at the base of the nipple.  A 3 cm diameter ring which encompassed the area of concern in the lower inner quadrant of the left retroareolar tissue extended down about 3 cm and this included the dilated duct cannulated at the beginning of the procedure.  Hemostasis was with 3-0 Vicryl figure-of-eight sutures and electrocautery.  3 titanium clips were placed at the 3, 6 and 9 o'clock position to indicate the extent of retroareolar tissue resection.   The deep breast tissue was approximated with interrupted 2-0 Vicryl figure-of-eight sutures.  The superficial soft tissue was approximated with interrupted 2-0 Vicryl sutures.  The skin was closed with a running 4-0 Vicryl subarticular suture.  Benzoin Steri-Strips were applied.  Telfa gauze was cut to avoid any pressure on the nipple and a fluff gauze dressing applied followed by a breast binder.  The specimen was sent fresh to pathology per protocol.  Patient tolerated the procedure well.

## 2017-06-10 ENCOUNTER — Telehealth: Payer: Self-pay | Admitting: *Deleted

## 2017-06-10 NOTE — Telephone Encounter (Signed)
Patient called the office today wanting to know if she should remove steri strips.   The patient had a left breast duct excision done on Friday, 06-07-17 by Dr. Bary Castilla in the O.R.  Patient was instructed to leave steri strips in place. Also, notified that steri strips will start to curl up and can be easily removed in the shower when they do.   The patient states that she has had minimal pain and that she feels more comfortable with surgical bra in place. Patient told she can wear the bra if this is more comfortable to her.   Patient aware to follow up as scheduled on 06-13-17 with Dr. Bary Castilla.

## 2017-06-13 ENCOUNTER — Encounter: Payer: Self-pay | Admitting: General Surgery

## 2017-06-13 ENCOUNTER — Ambulatory Visit (INDEPENDENT_AMBULATORY_CARE_PROVIDER_SITE_OTHER): Payer: BLUE CROSS/BLUE SHIELD | Admitting: General Surgery

## 2017-06-13 VITALS — BP 132/74 | HR 86 | Resp 12 | Ht 65.0 in | Wt 116.0 lb

## 2017-06-13 DIAGNOSIS — D242 Benign neoplasm of left breast: Secondary | ICD-10-CM

## 2017-06-13 LAB — SURGICAL PATHOLOGY

## 2017-06-13 NOTE — Progress Notes (Signed)
Patient ID: Sharon Le, female   DOB: 1955-11-28, 62 y.o.   MRN: [id number]  Chief Complaint  Patient presents with  . Follow-up    HPI Sharon Le is a 62 y.o. female here today for her follow up left breast excision done on 06/07/2017. Patient states she is doing well.  HPI  Past Medical History:  Diagnosis Date  . Abnormal TSH   . Adenomatous colon polyp   . Anemia   . Basal cell carcinoma   . Complication of anesthesia    HARD TIME WAKING UP AFTER HYSTERECTOMY  . Decreased fibrinogen ([place])   . Elevated TSH   . Family history of adverse reaction to anesthesia    PT UNSURE  . Fatigue   . History of kidney stones   . MVP (mitral valve prolapse)    DX AT AGE 10-PT ASYMPTOMATIC AND HAS NEVER SEEN CARDIOLOGIST OR HAD ECHO DONE  . Osteopenia   . Retinal tear   . Rosacea   . Vasomotor instability   . Yeast infection     Past Surgical History:  Procedure Laterality Date  . BASAL CELL CARCINOMA EXCISION  abd  . BREAST DUCTAL SYSTEM EXCISION Left 06/07/2017   Procedure: EXCISION DUCTAL SYSTEM BREAST;  Surgeon: Robert Bellow, MD;  Location: [facility] ORS;  Service: General;  Laterality: Left;  . COLONOSCOPY WITH PROPOFOL N/A 10/05/2015   Procedure: COLONOSCOPY WITH PROPOFOL;  Surgeon: Manya Silvas, MD;  Location: [facility] ENDOSCOPY;  Service: Endoscopy;  Laterality: N/A;  . VAGINAL HYSTERECTOMY  2010   [facility]  . WISDOM TOOTH EXTRACTION      Family History  Problem Relation Age of Onset  . Vascular Disease Father   . Colon cancer Paternal Aunt   . Heart disease Maternal Grandmother   . Breast cancer Maternal Aunt 65  . Diabetes Neg Hx   . Ovarian cancer Neg Hx     Social History Social History   Tobacco Use  . Smoking status: Never Smoker  . Smokeless tobacco: Never Used  Substance Use Topics  . Alcohol use: No  . Drug use: No    No Known Allergies  Current Outpatient Medications  Medication Sig Dispense Refill  . aspirin-acetaminophen-caffeine (EXCEDRIN  MIGRAINE) 250-250-65 MG tablet Take 1 tablet by mouth every 6 (six) hours as needed for headache.    . Azelaic Acid (FINACEA) 15 % cream Apply 1 application topically daily.     Marland Kitchen estradiol (VIVELLE-DOT) 0.025 MG/24HR PLACE 1 PATCH ONTO SKIN 2 TIMES A WEEK 24 patch 0  . olopatadine (PATANOL) 0.1 % ophthalmic solution Place 1 drop into both eyes 2 (two) times daily as needed for allergies (seasonal).      No current facility-administered medications for this visit.     Review of Systems Review of Systems  Constitutional: Negative.   Respiratory: Negative.   Cardiovascular: Negative.     Blood pressure 132/74, pulse 86, resp. rate 12, height 5\' 5"  (1.651 m), weight 116 lb (52.6 kg), SpO2 98 %.  Physical Exam Physical Exam  Constitutional: She is oriented to person, place, and time. She appears well-developed and well-nourished.  Pulmonary/Chest:    Neurological: She is alert and oriented to person, place, and time.  Skin: Skin is warm and dry.    Data Reviewed June 07, 2016 resection of retroareolar tissue:    A. DUCTAL TISSUE, LEFT RETROAREOLAR; EXCISION:  - FIBROCYSTIC CHANGE.  - SCLEROSING INTRADUCTAL PAPILLOMA, 4 MM.  - NEGATIVE FOR ATYPIA AND MALIGNANCY.  Assessment    Doing well post excision of retroareolar tissue for nipple drainage and an abnormal MRI.    Plan        Patient to return in one month. The patient is aware to call back for any questions or concerns.  HPI, Physical Exam, Assessment and Plan have been scribed under the direction and in the presence of Hervey Ard, MD.  Gaspar Cola, CMA  I have completed the exam and reviewed the above documentation for accuracy and completeness.  I agree with the above.  Haematologist has been used and any errors in dictation or transcription are unintentional.  Hervey Ard, M.D., F.A.C.S.  Forest Gleason [place] Droll 06/15/2017, 7:10 AM

## 2017-06-13 NOTE — Patient Instructions (Signed)
Patient to return in one month. The patient is aware to call back for any questions or concerns. 

## 2017-06-14 ENCOUNTER — Encounter: Payer: Self-pay | Admitting: Obstetrics and Gynecology

## 2017-06-15 DIAGNOSIS — D242 Benign neoplasm of left breast: Secondary | ICD-10-CM | POA: Insufficient documentation

## 2017-07-04 ENCOUNTER — Telehealth: Payer: Self-pay | Admitting: *Deleted

## 2017-07-04 DIAGNOSIS — Z01419 Encounter for gynecological examination (general) (routine) without abnormal findings: Secondary | ICD-10-CM

## 2017-07-04 NOTE — Telephone Encounter (Signed)
Labs faxed with confirmation. Sent pt my chart message.

## 2017-07-04 NOTE — Telephone Encounter (Signed)
Patient called and states that she would like to get her labs done before her physical with Dr. Tennis Must. Patient states she wants to get her labs done [place]. Patient states if you can fax those labs to that office it would be great. The fax # is [phone]  If you have any questions please call the patient her contact # is [phone]. Please advise. Thank you

## 2017-07-08 ENCOUNTER — Other Ambulatory Visit: Payer: BLUE CROSS/BLUE SHIELD

## 2017-07-08 DIAGNOSIS — Z01419 Encounter for gynecological examination (general) (routine) without abnormal findings: Secondary | ICD-10-CM

## 2017-07-09 LAB — LIPID PANEL
CHOL/HDL RATIO: 2.3 ratio (ref 0.0–4.4)
Cholesterol, Total: 166 mg/dL (ref 100–199)
HDL: 72 mg/dL (ref >39)
LDL Calculated: 82 mg/dL (ref 0–99)
TRIGLYCERIDES: 58 mg/dL (ref 0–149)
VLDL CHOLESTEROL CAL: 12 mg/dL (ref 5–40)

## 2017-07-09 LAB — VITAMIN D 25 HYDROXY (VIT D DEFICIENCY, FRACTURES): Vit D, 25-Hydroxy: 34.6 ng/mL (ref 30.0–100.0)

## 2017-07-09 LAB — GLUCOSE, RANDOM: Glucose: 101 mg/dL — ABNORMAL HIGH (ref 65–99)

## 2017-07-09 LAB — HEMOGLOBIN: Hemoglobin: 12.5 g/dL (ref 11.1–15.9)

## 2017-07-09 LAB — HEMOGLOBIN A1C
Est. average glucose Bld gHb Est-mCnc: 120 mg/dL
HEMOGLOBIN A1C: 5.8 % — AB (ref 4.8–5.6)

## 2017-07-09 LAB — TSH: TSH: 6.21 u[IU]/mL — ABNORMAL HIGH (ref 0.450–4.500)

## 2017-07-12 ENCOUNTER — Other Ambulatory Visit: Payer: Self-pay

## 2017-07-12 ENCOUNTER — Encounter: Payer: Self-pay | Admitting: Obstetrics and Gynecology

## 2017-07-12 DIAGNOSIS — R7989 Other specified abnormal findings of blood chemistry: Secondary | ICD-10-CM

## 2017-07-16 ENCOUNTER — Encounter: Payer: Self-pay | Admitting: General Surgery

## 2017-07-16 ENCOUNTER — Ambulatory Visit (INDEPENDENT_AMBULATORY_CARE_PROVIDER_SITE_OTHER): Payer: BLUE CROSS/BLUE SHIELD | Admitting: General Surgery

## 2017-07-16 VITALS — BP 122/64 | HR 68 | Resp 11 | Ht 65.0 in | Wt 120.0 lb

## 2017-07-16 DIAGNOSIS — D242 Benign neoplasm of left breast: Secondary | ICD-10-CM

## 2017-07-16 NOTE — Patient Instructions (Addendum)
The patient is aware to call back for any questions or new concerns. May use creams to incision as desired.

## 2017-07-16 NOTE — Progress Notes (Signed)
Patient ID: Sharon Le, female   DOB: 1955/10/30, 62 y.o.   MRN: [id number]  Chief Complaint  Patient presents with  . Routine Post Op    HPI Sharon Le is a 62 y.o. female here today for her follow up left breast excision done on 06/07/2017. Patient states she is doing well. Appetite is back and no pain. She is on a stool softener for mild constipation.  HPI  Past Medical History:  Diagnosis Date  . Abnormal TSH   . Adenomatous colon polyp   . Anemia   . Basal cell carcinoma   . Complication of anesthesia    HARD TIME WAKING UP AFTER HYSTERECTOMY  . Decreased fibrinogen ([place])   . Elevated TSH   . Family history of adverse reaction to anesthesia    PT UNSURE  . Fatigue   . History of kidney stones   . MVP (mitral valve prolapse)    DX AT AGE 84-PT ASYMPTOMATIC AND HAS NEVER SEEN CARDIOLOGIST OR HAD ECHO DONE  . Osteopenia   . Retinal tear   . Rosacea   . Vasomotor instability   . Yeast infection     Past Surgical History:  Procedure Laterality Date  . BASAL CELL CARCINOMA EXCISION  abd  . BREAST DUCTAL SYSTEM EXCISION Left 06/07/2017   Procedure: EXCISION DUCTAL SYSTEM BREAST;  Surgeon: Robert Bellow, MD;  Location: [facility] ORS;  Service: General;  Laterality: Left;  . COLONOSCOPY WITH PROPOFOL N/A 10/05/2015   Procedure: COLONOSCOPY WITH PROPOFOL;  Surgeon: Manya Silvas, MD;  Location: [facility] ENDOSCOPY;  Service: Endoscopy;  Laterality: N/A;  . VAGINAL HYSTERECTOMY  2010   [facility]  . WISDOM TOOTH EXTRACTION      Family History  Problem Relation Age of Onset  . Vascular Disease Father   . Colon cancer Paternal Aunt   . Heart disease Maternal Grandmother   . Breast cancer Maternal Aunt 65  . Diabetes Neg Hx   . Ovarian cancer Neg Hx     Social History Social History   Tobacco Use  . Smoking status: Never Smoker  . Smokeless tobacco: Never Used  Substance Use Topics  . Alcohol use: No  . Drug use: No    No Known Allergies  Current Outpatient  Medications  Medication Sig Dispense Refill  . aspirin-acetaminophen-caffeine (EXCEDRIN MIGRAINE) 250-250-65 MG tablet Take 1 tablet by mouth every 6 (six) hours as needed for headache.    . Azelaic Acid (FINACEA) 15 % cream Apply 1 application topically daily.     Mariane Baumgarten Calcium (STOOL SOFTENER PO) Take by mouth daily.    Marland Kitchen estradiol (VIVELLE-DOT) 0.025 MG/24HR PLACE 1 PATCH ONTO SKIN 2 TIMES A WEEK 24 patch 0  . olopatadine (PATANOL) 0.1 % ophthalmic solution Place 1 drop into both eyes 2 (two) times daily as needed for allergies (seasonal).      No current facility-administered medications for this visit.     Review of Systems Review of Systems  Constitutional: Negative.   Respiratory: Negative.   Cardiovascular: Negative.   Gastrointestinal: Positive for constipation.    Blood pressure 122/64, pulse 68, resp. rate 11, height 5\' 5"  (1.651 m), weight 120 lb (54.4 kg), SpO2 98 %.  Physical Exam Physical Exam  Constitutional: She is oriented to person, place, and time. She appears well-developed and well-nourished.  Pulmonary/Chest:    Neurological: She is alert and oriented to person, place, and time.  Skin: Skin is warm and dry.  Psychiatric: Her behavior  is normal.    Data Reviewed February 05, 2018 retroareolar duct excision pathology: :  A. DUCTAL TISSUE, LEFT RETROAREOLAR; EXCISION:  - FIBROCYSTIC CHANGE.  - SCLEROSING INTRADUCTAL PAPILLOMA, 4 MM.  - NEGATIVE FOR ATYPIA AND MALIGNANCY.   Assessment    Patient may resume annual screening mammograms with her GYN.  She was advised of the first study may raise his eyebrows based on the volume of tissue removed from the retroareolar area and the previously placed titanium clips.    Plan    Follow up as needed. May use creams to incision as desired. The patient is aware to call back for any questions or new concerns.  The patient requested a copy of her operative report which was provided.     HPI, Physical  Exam, Assessment and Plan have been scribed under the direction and in the presence of Robert Bellow, MD. Karie Fetch, RN  I have completed the exam and reviewed the above documentation for accuracy and completeness.  I agree with the above.  Haematologist has been used and any errors in dictation or transcription are unintentional.  Hervey Ard, M.D., F.A.C.S.  Forest Gleason Lugene Beougher 07/16/2017, 8:54 PM

## 2017-07-17 ENCOUNTER — Ambulatory Visit (INDEPENDENT_AMBULATORY_CARE_PROVIDER_SITE_OTHER): Payer: BLUE CROSS/BLUE SHIELD | Admitting: Obstetrics and Gynecology

## 2017-07-17 ENCOUNTER — Encounter: Payer: Self-pay | Admitting: Obstetrics and Gynecology

## 2017-07-17 VITALS — BP 124/66 | HR 68 | Ht 65.0 in | Wt 120.1 lb

## 2017-07-17 DIAGNOSIS — Z1231 Encounter for screening mammogram for malignant neoplasm of breast: Secondary | ICD-10-CM

## 2017-07-17 DIAGNOSIS — Z87898 Personal history of other specified conditions: Secondary | ICD-10-CM

## 2017-07-17 DIAGNOSIS — M8580 Other specified disorders of bone density and structure, unspecified site: Secondary | ICD-10-CM

## 2017-07-17 DIAGNOSIS — Z01419 Encounter for gynecological examination (general) (routine) without abnormal findings: Secondary | ICD-10-CM | POA: Diagnosis not present

## 2017-07-17 DIAGNOSIS — Z780 Asymptomatic menopausal state: Secondary | ICD-10-CM | POA: Diagnosis not present

## 2017-07-17 DIAGNOSIS — Z8742 Personal history of other diseases of the female genital tract: Secondary | ICD-10-CM

## 2017-07-17 DIAGNOSIS — R7989 Other specified abnormal findings of blood chemistry: Secondary | ICD-10-CM | POA: Diagnosis not present

## 2017-07-17 DIAGNOSIS — Z1239 Encounter for other screening for malignant neoplasm of breast: Secondary | ICD-10-CM

## 2017-07-17 DIAGNOSIS — Z1211 Encounter for screening for malignant neoplasm of colon: Secondary | ICD-10-CM

## 2017-07-17 DIAGNOSIS — D242 Benign neoplasm of left breast: Secondary | ICD-10-CM

## 2017-07-17 DIAGNOSIS — Z90711 Acquired absence of uterus with remaining cervical stump: Secondary | ICD-10-CM

## 2017-07-17 MED ORDER — ESTRADIOL 0.025 MG/24HR TD PTTW: 1.0000 | MEDICATED_PATCH | TRANSDERMAL | 3 refills | Status: DC

## 2017-07-17 NOTE — Patient Instructions (Addendum)
1.  Pap smear is done 2.  Stool guaiac cards are given for colon cancer screening 3.  Screening labs are obtained including thyroid function tests because of a recently elevated TSH 4.  Continue with healthy eating and exercise with some controlled weight gain 5.  Continue with Vivelle dot for estrogen replacement therapy 6.  Return in 1 year for annual exam.  Mammography will be resumed following next annual exam appointment due to patient's recent surgery on left breast. 7.  Shingles vaccine is recommended  Recombinant Zoster (Shingles) Vaccine, RZV: What You Need to Know 1. Why get vaccinated? Shingles (also called herpes zoster, or just zoster) is a painful skin rash, often with blisters. Shingles is caused by the varicella zoster virus, the same virus that causes chickenpox. After you have chickenpox, the virus stays in your body and can cause shingles later in life. You can't catch shingles from another person. However, a person who has never had chickenpox (or chickenpox vaccine) could get chickenpox from someone with shingles. A shingles rash usually appears on one side of the face or body and heals within 2 to 4 weeks. Its main symptom is pain, which can be severe. Other symptoms can include fever, headache, chills and upset stomach. Very rarely, a shingles infection can lead to pneumonia, hearing problems, blindness, brain inflammation (encephalitis), or death. For about 1 person in 5, severe pain can continue even long after the rash has cleared up. This long-lasting pain is called post-herpetic neuralgia (PHN). Shingles is far more common in people 26 years of age and older than in younger people, and the risk increases with age. It is also more common in people whose immune system is weakened because of a disease such as cancer, or by drugs such as steroids or chemotherapy. At least 1 million people a year in the [place] get shingles. 2. Shingles vaccine (recombinant) Recombinant  shingles vaccine was approved by FDA in 2017 for the prevention of shingles. In clinical trials, it was more than 90% effective in preventing shingles. It can also reduce the likelihood of PHN. Two doses, 2 to 6 months apart, are recommended for adults 40 and older. This vaccine is also recommended for people who have already gotten the live shingles vaccine (Zostavax). There is no live virus in this vaccine. 3. Some people should not get this vaccine Tell your vaccine provider if you:  Have any severe, life-threatening allergies. A person who has ever had a life-threatening allergic reaction after a dose of recombinant shingles vaccine, or has a severe allergy to any component of this vaccine, may be advised not to be vaccinated. Ask your health care provider if you want information about vaccine components.  Are pregnant or breastfeeding. There is not much information about use of recombinant shingles vaccine in pregnant or nursing women. Your healthcare provider might recommend delaying vaccination.  Are not feeling well. If you have a mild illness, such as a cold, you can probably get the vaccine today. If you are moderately or severely ill, you should probably wait until you recover. Your doctor can advise you.  4. Risks of a vaccine reaction With any medicine, including vaccines, there is a chance of reactions. After recombinant shingles vaccination, a person might experience:  Pain, redness, soreness, or swelling at the site of the injection  Headache, muscle aches, fever, shivering, fatigue  In clinical trials, most people got a sore arm with mild or moderate pain after vaccination, and some also had  redness and swelling where they got the shot. Some people felt tired, had muscle pain, a headache, shivering, fever, stomach pain, or nausea. About 1 out of 6 people who got recombinant zoster vaccine experienced side effects that prevented them from doing regular activities. Symptoms went  away on their own in about 2 to 3 days. Side effects were more common in younger people. You should still get the second dose of recombinant zoster vaccine even if you had one of these reactions after the first dose. Other things that could happen after this vaccine:  People sometimes faint after medical procedures, including vaccination. Sitting or lying down for about 15 minutes can help prevent fainting and injuries caused by a fall. Tell your provider if you feel dizzy or have vision changes or ringing in the ears.  Some people get shoulder pain that can be more severe and longer-lasting than routine soreness that can follow injections. This happens very rarely.  Any medication can cause a severe allergic reaction. Such reactions to a vaccine are estimated at about 1 in a million doses, and would happen within a few minutes to a few hours after the vaccination. As with any medicine, there is a very remote chance of a vaccine causing a serious injury or death. The safety of vaccines is always being monitored. For more information, visit: http://www.aguilar.org/ 5. What if there is a serious problem? What should I look for?  Look for anything that concerns you, such as signs of a severe allergic reaction, very high fever, or unusual behavior. Signs of a severe allergic reaction can include hives, swelling of the face and throat, difficulty breathing, a fast heartbeat, dizziness, and weakness. These would usually start a few minutes to a few hours after the vaccination. What should I do?  If you think it is a severe allergic reaction or other emergency that can't wait, call 9-1-1 and get to the nearest hospital. Otherwise, call your health care provider. Afterward, the reaction should be reported to the Vaccine Adverse Event Reporting System (VAERS). Your doctor should file this report, or you can do it yourself through the VAERS web site atwww.vaers.https://www.bray.com/ by calling 905-616-5771. VAERS  does not give medical advice. 6. How can I learn more?  Ask your healthcare provider. He or she can give you the vaccine package insert or suggest other sources of information.  Call your local or state health department.  Contact the Centers for Disease Control and Prevention (CDC): ? Call (209)677-1111 (1-800-CDC-INFO) or ? Visit the CDC's website at http://hunter.com/ CDC Vaccine Information Statement (VIS) Recombinant Zoster Vaccine (07/02/2016) This information is not intended to replace advice given to you by your health care provider. Make sure you discuss any questions you have with your health care provider. Document Released: 07/17/2016 Document Revised: 07/17/2016 Document Reviewed: 07/17/2016 Elsevier Interactive Patient Education  2018 [employer] Maintenance for Postmenopausal Women Menopause is a normal process in which your reproductive ability comes to an end. This process happens gradually over a span of months to years, usually between the ages of 39 and 34. Menopause is complete when you have missed 12 consecutive menstrual periods. It is important to talk with your health care provider about some of the most common conditions that affect postmenopausal women, such as heart disease, cancer, and bone loss (osteoporosis). Adopting a healthy lifestyle and getting preventive care can help to promote your health and wellness. Those actions can also lower your chances of developing some of these common  conditions. What should I know about menopause? During menopause, you may experience a number of symptoms, such as:  Moderate-to-severe hot flashes.  Night sweats.  Decrease in sex drive.  Mood swings.  Headaches.  Tiredness.  Irritability.  Memory problems.  Insomnia.  Choosing to treat or not to treat menopausal changes is an individual decision that you make with your health care provider. What should I know about hormone replacement therapy and  supplements? Hormone therapy products are effective for treating symptoms that are associated with menopause, such as hot flashes and night sweats. Hormone replacement carries certain risks, especially as you become older. If you are thinking about using estrogen or estrogen with progestin treatments, discuss the benefits and risks with your health care provider. What should I know about heart disease and stroke? Heart disease, heart attack, and stroke become more likely as you age. This may be due, in part, to the hormonal changes that your body experiences during menopause. These can affect how your body processes dietary fats, triglycerides, and cholesterol. Heart attack and stroke are both medical emergencies. There are many things that you can do to help prevent heart disease and stroke:  Have your blood pressure checked at least every 1-2 years. High blood pressure causes heart disease and increases the risk of stroke.  If you are 29-59 years old, ask your health care provider if you should take aspirin to prevent a heart attack or a stroke.  Do not use any tobacco products, including cigarettes, chewing tobacco, or electronic cigarettes. If you need help quitting, ask your health care provider.  It is important to eat a healthy diet and maintain a healthy weight. ? Be sure to include plenty of vegetables, fruits, low-fat dairy products, and lean protein. ? Avoid eating foods that are high in solid fats, added sugars, or salt (sodium).  Get regular exercise. This is one of the most important things that you can do for your health. ? Try to exercise for at least 150 minutes each week. The type of exercise that you do should increase your heart rate and make you sweat. This is known as moderate-intensity exercise. ? Try to do strengthening exercises at least twice each week. Do these in addition to the moderate-intensity exercise.  Know your numbers.Ask your health care provider to check  your cholesterol and your blood glucose. Continue to have your blood tested as directed by your health care provider.  What should I know about cancer screening? There are several types of cancer. Take the following steps to reduce your risk and to catch any cancer development as early as possible. Breast Cancer  Practice breast self-awareness. ? This means understanding how your breasts normally appear and feel. ? It also means doing regular breast self-exams. Let your health care provider know about any changes, no matter how small.  If you are 96 or older, have a clinician do a breast exam (clinical breast exam or CBE) every year. Depending on your age, family history, and medical history, it may be recommended that you also have a yearly breast X-ray (mammogram).  If you have a family history of breast cancer, talk with your health care provider about genetic screening.  If you are at high risk for breast cancer, talk with your health care provider about having an MRI and a mammogram every year.  Breast cancer (BRCA) gene test is recommended for women who have family members with BRCA-related cancers. Results of the assessment will determine the need  for genetic counseling and BRCA1 and for BRCA2 testing. BRCA-related cancers include these types: ? Breast. This occurs in males or females. ? Ovarian. ? Tubal. This may also be called fallopian tube cancer. ? Cancer of the abdominal or pelvic lining (peritoneal cancer). ? Prostate. ? Pancreatic.  Cervical, Uterine, and Ovarian Cancer Your health care provider may recommend that you be screened regularly for cancer of the pelvic organs. These include your ovaries, uterus, and vagina. This screening involves a pelvic exam, which includes checking for microscopic changes to the surface of your cervix (Pap test).  For women ages 21-65, health care providers may recommend a pelvic exam and a Pap test every three years. For women ages 10-65,  they may recommend the Pap test and pelvic exam, combined with testing for human papilloma virus (HPV), every five years. Some types of HPV increase your risk of cervical cancer. Testing for HPV may also be done on women of any age who have unclear Pap test results.  Other health care providers may not recommend any screening for nonpregnant women who are considered low risk for pelvic cancer and have no symptoms. Ask your health care provider if a screening pelvic exam is right for you.  If you have had past treatment for cervical cancer or a condition that could lead to cancer, you need Pap tests and screening for cancer for at least 20 years after your treatment. If Pap tests have been discontinued for you, your risk factors (such as having a new sexual partner) need to be reassessed to determine if you should start having screenings again. Some women have medical problems that increase the chance of getting cervical cancer. In these cases, your health care provider may recommend that you have screening and Pap tests more often.  If you have a family history of uterine cancer or ovarian cancer, talk with your health care provider about genetic screening.  If you have vaginal bleeding after reaching menopause, tell your health care provider.  There are currently no reliable tests available to screen for ovarian cancer.  Lung Cancer Lung cancer screening is recommended for adults 43-40 years old who are at high risk for lung cancer because of a history of smoking. A yearly low-dose CT scan of the lungs is recommended if you:  Currently smoke.  Have a history of at least 30 pack-years of smoking and you currently smoke or have quit within the past 15 years. A pack-year is smoking an average of one pack of cigarettes per day for one year.  Yearly screening should:  Continue until it has been 15 years since you quit.  Stop if you develop a health problem that would prevent you from having lung  cancer treatment.  Colorectal Cancer  This type of cancer can be detected and can often be prevented.  Routine colorectal cancer screening usually begins at age 59 and continues through age 74.  If you have risk factors for colon cancer, your health care provider may recommend that you be screened at an earlier age.  If you have a family history of colorectal cancer, talk with your health care provider about genetic screening.  Your health care provider may also recommend using home test kits to check for hidden blood in your stool.  A small camera at the end of a tube can be used to examine your colon directly (sigmoidoscopy or colonoscopy). This is done to check for the earliest forms of colorectal cancer.  Direct examination of the  colon should be repeated every 5-10 years until age 73. However, if early forms of precancerous polyps or small growths are found or if you have a family history or genetic risk for colorectal cancer, you may need to be screened more often.  Skin Cancer  Check your skin from head to toe regularly.  Monitor any moles. Be sure to tell your health care provider: ? About any new moles or changes in moles, especially if there is a change in a mole's shape or color. ? If you have a mole that is larger than the size of a pencil eraser.  If any of your family members has a history of skin cancer, especially at a young age, talk with your health care provider about genetic screening.  Always use sunscreen. Apply sunscreen liberally and repeatedly throughout the day.  Whenever you are outside, protect yourself by wearing long sleeves, pants, a wide-brimmed hat, and sunglasses.  What should I know about osteoporosis? Osteoporosis is a condition in which bone destruction happens more quickly than new bone creation. After menopause, you may be at an increased risk for osteoporosis. To help prevent osteoporosis or the bone fractures that can happen because of  osteoporosis, the following is recommended:  If you are 39-62 years old, get at least 1,000 mg of calcium and at least 600 mg of vitamin D per day.  If you are older than age 88 but younger than age 33, get at least 1,200 mg of calcium and at least 600 mg of vitamin D per day.  If you are older than age 81, get at least 1,200 mg of calcium and at least 800 mg of vitamin D per day.  Smoking and excessive alcohol intake increase the risk of osteoporosis. Eat foods that are rich in calcium and vitamin D, and do weight-bearing exercises several times each week as directed by your health care provider. What should I know about how menopause affects my mental health? Depression may occur at any age, but it is more common as you become older. Common symptoms of depression include:  Low or sad mood.  Changes in sleep patterns.  Changes in appetite or eating patterns.  Feeling an overall lack of motivation or enjoyment of activities that you previously enjoyed.  Frequent crying spells.  Talk with your health care provider if you think that you are experiencing depression. What should I know about immunizations? It is important that you get and maintain your immunizations. These include:  Tetanus, diphtheria, and pertussis (Tdap) booster vaccine.  Influenza every year before the flu season begins.  Pneumonia vaccine.  Shingles vaccine.  Your health care provider may also recommend other immunizations. This information is not intended to replace advice given to you by your health care provider. Make sure you discuss any questions you have with your health care provider. Document Released: 06/29/2005 Document Revised: 11/25/2015 Document Reviewed: 02/08/2015 Elsevier Interactive Patient Education  2018 [employer].

## 2017-07-17 NOTE — Progress Notes (Signed)
Patient ID: Sharon Le, female   DOB: [birth year], 62 y.o.   MRN: [id number] ANNUAL PREVENTATIVE CARE GYN  ENCOUNTER NOTE  Subjective:       Sharon Le is a 62 y.o. G0P0000 female here for a routine annual gynecologic exam.  Current complaints: 1.  Remote history of abnormal Pap smears; none in the last decade; Want pap- doesn't not like 3 year plan  2. Wants pap q year until age 89  3. With elevated tsh- TSH PANEL DRAWN TODAY  Sharon Le is loss some weight over the past several months due to the stress of the abnormal breast MRI and recent breast surgery.  She is very pleased overall and has been dismissed from Dr. Tollie Pizza for management of the intraductal papilloma that was excised.  She is to resume annual mammography with me starting with the next year. Recent elevated TSH has prompted [place] to obtain thyroid function panel today.   Gynecologic History No LMP recorded. Patient has had a hysterectomy.status post [facility] Contraception: post menopausal status Last Pap: 06/21/2018 neg. Results were: normal Last mammogram: 03/26/2017- BIRAD 2  birad 1. Results were: normal  Obstetric History OB History  Gravida Para Term Preterm AB Living  0 0 0 0 0 0  SAB TAB Ectopic Multiple Live Births  0 0 0 0        Obstetric Comments  1st Menstrual Cycle:  16       Past Medical History:  Diagnosis Date  . Abnormal TSH   . Adenomatous colon polyp   . Anemia   . Basal cell carcinoma   . Complication of anesthesia    HARD TIME WAKING UP AFTER HYSTERECTOMY  . Decreased fibrinogen ([place])   . Elevated TSH   . Family history of adverse reaction to anesthesia    PT UNSURE  . Fatigue   . History of kidney stones   . MVP (mitral valve prolapse)    DX AT AGE 43-PT ASYMPTOMATIC AND HAS NEVER SEEN CARDIOLOGIST OR HAD ECHO DONE  . Osteopenia   . Retinal tear   . Rosacea   . Vasomotor instability   . Yeast infection     Past Surgical History:  Procedure Laterality Date  . BASAL CELL CARCINOMA  EXCISION  abd  . BREAST DUCTAL SYSTEM EXCISION Left 06/07/2017   Procedure: EXCISION DUCTAL SYSTEM BREAST;  Surgeon: Robert Bellow, MD;  Location: [facility] ORS;  Service: General;  Laterality: Left;  . COLONOSCOPY WITH PROPOFOL N/A 10/05/2015   Procedure: COLONOSCOPY WITH PROPOFOL;  Surgeon: Manya Silvas, MD;  Location: [facility] ENDOSCOPY;  Service: Endoscopy;  Laterality: N/A;  . VAGINAL HYSTERECTOMY  2010   [facility]  . WISDOM TOOTH EXTRACTION      Current Outpatient Medications on File Prior to Visit  Medication Sig Dispense Refill  . aspirin-acetaminophen-caffeine (EXCEDRIN MIGRAINE) 250-250-65 MG tablet Take 1 tablet by mouth every 6 (six) hours as needed for headache.    . Azelaic Acid (FINACEA) 15 % cream Apply 1 application topically daily.     Mariane Baumgarten Calcium (STOOL SOFTENER PO) Take by mouth daily.    Marland Kitchen estradiol (VIVELLE-DOT) 0.025 MG/24HR PLACE 1 PATCH ONTO SKIN 2 TIMES A WEEK 24 patch 0  . olopatadine (PATANOL) 0.1 % ophthalmic solution Place 1 drop into both eyes 2 (two) times daily as needed for allergies (seasonal).      No current facility-administered medications on file prior to visit.     No Known Allergies  Social History  Socioeconomic History  . Marital status: Single    Spouse name: Not on file  . Number of children: Not on file  . Years of education: Not on file  . Highest education level: Not on file  Social Needs  . Financial resource strain: Not on file  . Food insecurity - worry: Not on file  . Food insecurity - inability: Not on file  . Transportation needs - medical: Not on file  . Transportation needs - non-medical: Not on file  Occupational History  . Not on file  Tobacco Use  . Smoking status: Never Smoker  . Smokeless tobacco: Never Used  Substance and Sexual Activity  . Alcohol use: No  . Drug use: No  . Sexual activity: Not Currently  Other Topics Concern  . Not on file  Social History Narrative  . Not on file    Family History   Problem Relation Age of Onset  . Vascular Disease Father   . Colon cancer Paternal Aunt   . Heart disease Maternal Grandmother   . Breast cancer Maternal Aunt 65  . Diabetes Neg Hx   . Ovarian cancer Neg Hx     The following portions of the patient's history were reviewed and updated as appropriate: allergies, current medications, past family history, past medical history, past social history, past surgical history and problem list.  Review of Systems Review of Systems  Constitutional: Negative.   HENT: Negative.   Eyes: Negative.   Respiratory: Negative.   Cardiovascular: Negative.        History of mitral valve prolapse  Gastrointestinal: Negative.   Genitourinary: Negative.   Musculoskeletal: Negative.   Skin: Negative.   Neurological: Negative.   Endo/Heme/Allergies: Negative.   Psychiatric/Behavioral: Negative.       Objective:    BP 124/66   Pulse 68   Ht 5\' 5"  (1.651 m)   Wt 120 lb 1.6 oz (54.5 kg)   BMI 19.99 kg/m   CONSTITUTIONAL: Well-developed, well-nourished female in no acute distress.  PSYCHIATRIC: Normal mood and affect. Normal behavior. Normal judgment and thought content. [place]: Alert and oriented to person, place, and time. Normal muscle tone coordination. No cranial nerve deficit noted. HENT:  Normocephalic, atraumatic, External right and left ear normal. Oropharynx is clear and moist EYES: Conjunctivae and EOM are normal.No scleral icterus.  NECK: Normal range of motion, supple, no masses.  Normal thyroid.  SKIN: Skin is warm and dry. No rash noted. Not diaphoretic. No erythema. No pallor. CARDIOVASCULAR: Normal heart rate noted, regular rhythm, no murmur. RESPIRATORY: Clear to auscultation bilaterally. Effort and breath sounds normal, no problems with respiration noted. BREASTS: Symmetric in size. No masses, skin changes, nipple drainage, or lymphadenopathy.  Left nipple demonstrates semilunar healing incision inferior to the nipple, along  with superior and inferior semilunar creases within the areola itself (these are to resolve slowly over time per Dr. Tollie Pizza) ABDOMEN: Soft, normal bowel sounds, no distention noted.  No tenderness, rebound or guarding.  BLADDER: Normal PELVIC:  External Genitalia: Normal  BUS: Normal  Vagina: Normal; fair estrogen effect; single digit exam performed  Cervix: Normal; stenotic os; Pap smear done  Uterus: surgically absent  Adnexa: Normal; nonpalpable and nontender  RV: External Exam NormaI, No Rectal Masses and Normal Sphincter tone  MUSCULOSKELETAL: Normal range of motion. No tenderness.  No cyanosis, clubbing, or edema.  2+ distal pulses. LYMPHATIC: No Axillary, Supraclavicular, or Inguinal Adenopathy.    Assessment:   Annual gynecologic examination 63 y.o. Contraception: post  menopausal status BMI- 19 Status post LSH Remote history of abnormal Pap smears; patient desires annual Pap smear testing. Menopause, asymptomatic on ERT therapy; desires to continue Status post excision of left breast intraductal papilloma  Plan:  Pap: Pap, Reflex if ASCUS Mammogram: Ordered Stool Guaiac Testing: ORDERED- 2022 DUE FOR COLONOSCOPY Labs: vit d lipid a1c fbs tsh- done 06/2017- TSH PANEL DONE TODAY Routine preventative health maintenance measures emphasized: Exercise/Diet/Weight control, Tobacco Warnings and Alcohol/Substance use risks Vivelle-Dot is refilled for 1 year Return to [place], CMA  Brayton Mars, MD   Note: This dictation was prepared with Dragon dictation along with smaller phrase technology. Any transcriptional errors that result from this process are unintentional.

## 2017-07-18 ENCOUNTER — Encounter: Payer: Self-pay | Admitting: Obstetrics and Gynecology

## 2017-07-18 LAB — THYROID PANEL WITH TSH
Free Thyroxine Index: 1.5 (ref 1.2–4.9)
T3 Uptake Ratio: 24 % (ref 24–39)
T4, Total: 6.2 ug/dL (ref 4.5–12.0)
TSH: 8.4 u[IU]/mL — AB (ref 0.450–4.500)

## 2017-07-19 LAB — SPECIMEN STATUS REPORT

## 2017-07-19 LAB — PAP IG W/ RFLX HPV ASCU: PAP Smear Comment: 0

## 2017-08-19 DIAGNOSIS — Z1211 Encounter for screening for malignant neoplasm of colon: Secondary | ICD-10-CM | POA: Diagnosis not present

## 2017-08-20 LAB — FECAL OCCULT BLOOD, IMMUNOCHEMICAL: FECAL OCCULT BLD: NEGATIVE

## 2017-12-23 ENCOUNTER — Ambulatory Visit
Admission: RE | Admit: 2017-12-23 | Discharge: 2017-12-23 | Disposition: A | Discharge status: Home / Self Care | Payer: BLUE CROSS/BLUE SHIELD | Source: Ambulatory Visit | Attending: Obstetrics and Gynecology | Admitting: Obstetrics and Gynecology

## 2017-12-23 DIAGNOSIS — Z1239 Encounter for other screening for malignant neoplasm of breast: Secondary | ICD-10-CM

## 2017-12-23 DIAGNOSIS — Z1231 Encounter for screening mammogram for malignant neoplasm of breast: Secondary | ICD-10-CM | POA: Insufficient documentation

## 2018-02-03 IMAGING — MG 2D DIGITAL DIAGNOSTIC UNILATERAL RIGHT MAMMOGRAM WITH CAD AND AD
6 series · 6 of 14 positions shown · non-contrast
Comparison: Previous exam(s).

CLINICAL DATA: Callback from screening mammogram for possible mass
right breast

EXAM:
2D DIGITAL DIAGNOSTIC RIGHT MAMMOGRAM WITH CAD AND ADJUNCT TOMO
ULTRASOUND RIGHT BREAST

[R MLO]
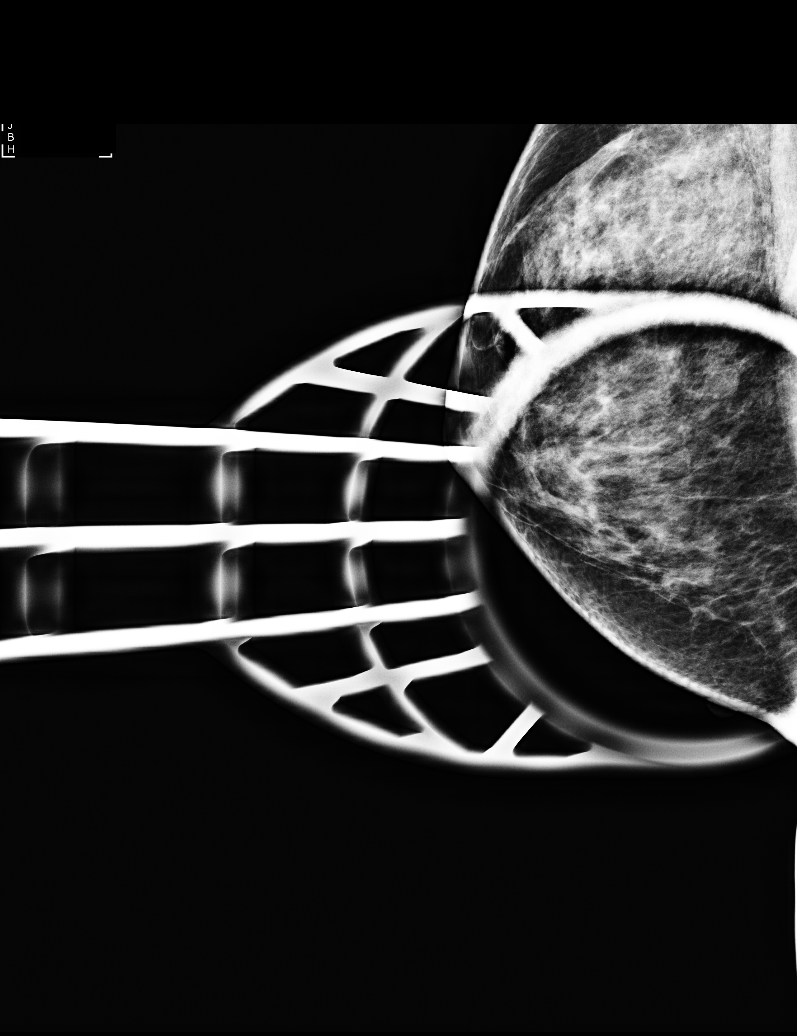

[R CC]
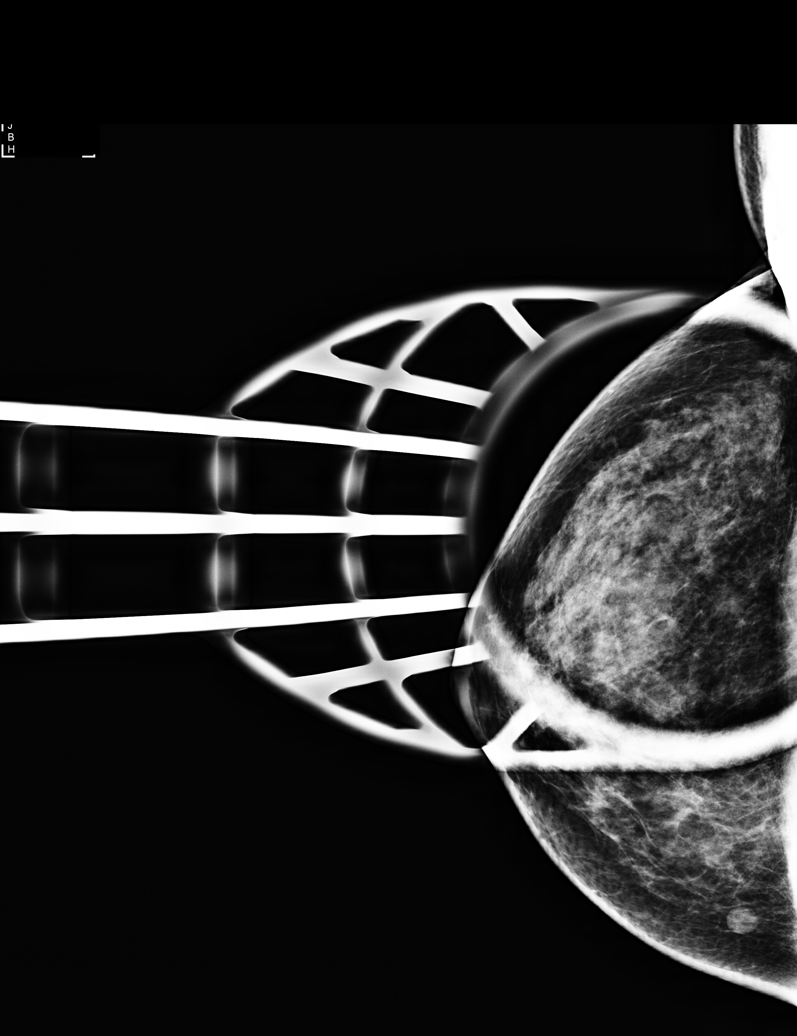

[R CC synth-2D]
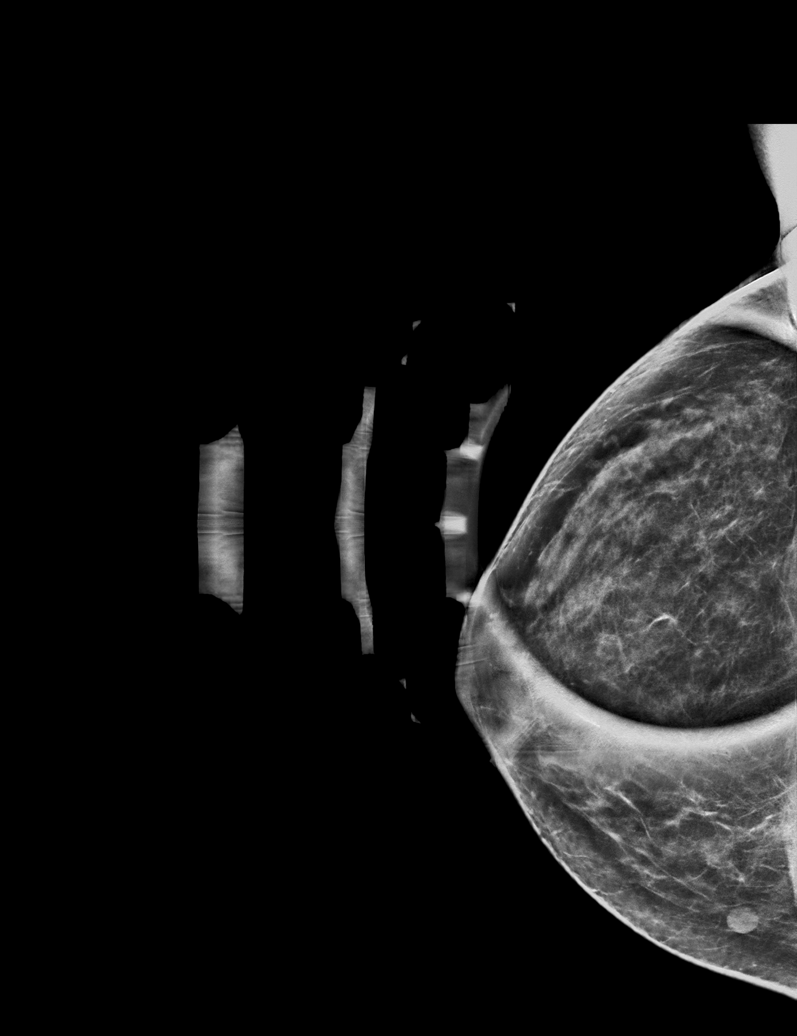

[R MLO synth-2D]
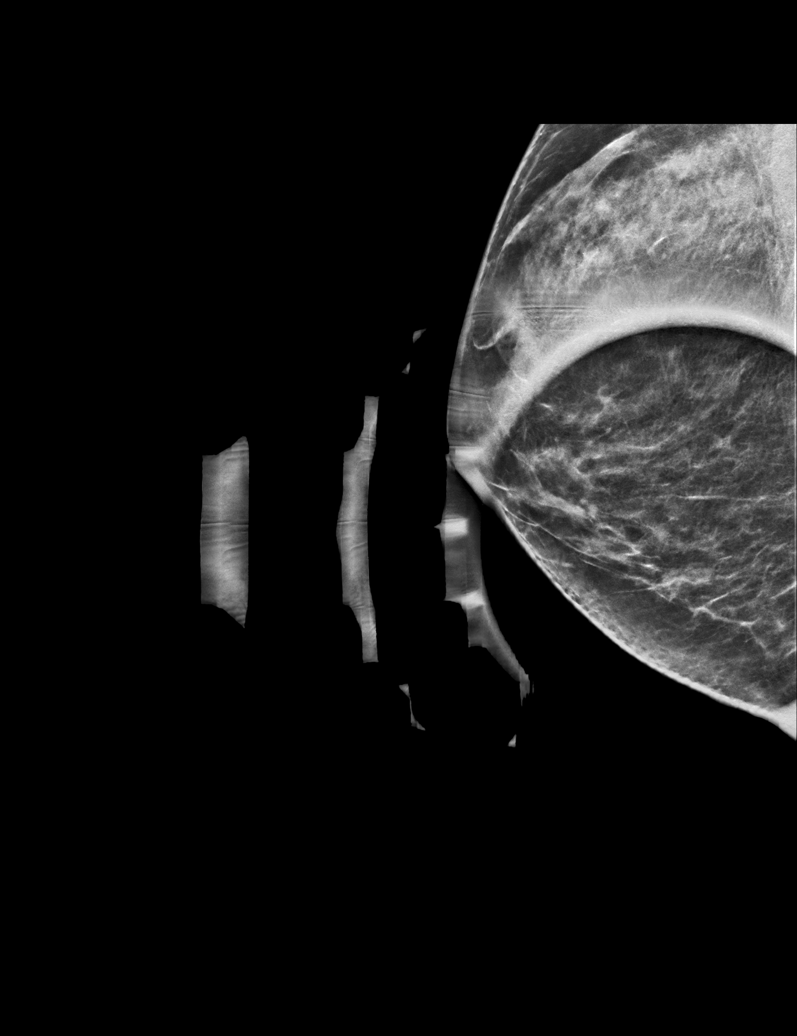

[R CC tomo · tomo slice 29/58.0]
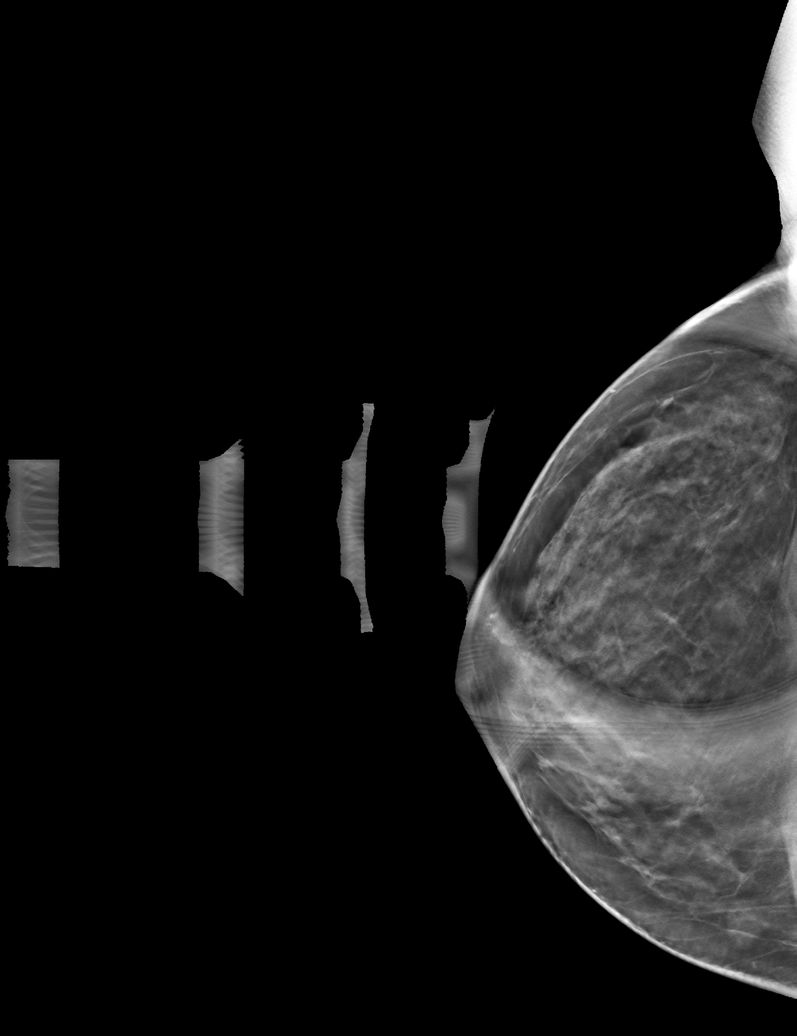

[R MLO tomo · tomo slice 27/53.0]
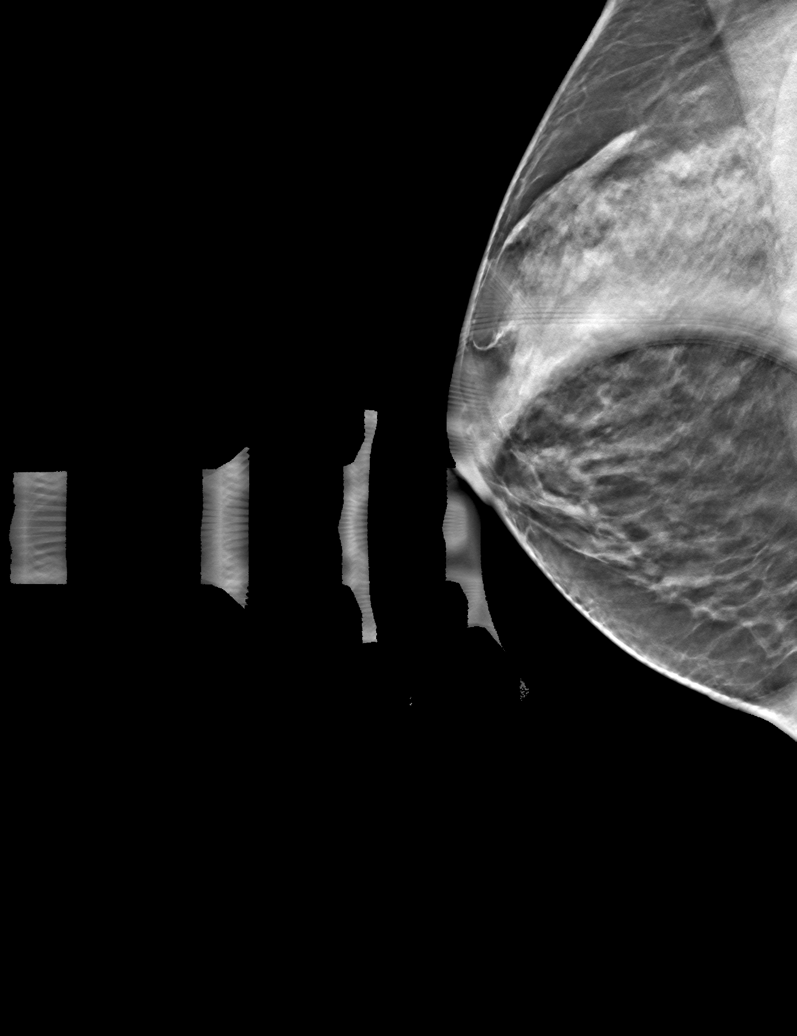

[6 of 14 positions shown; findings below may reference images not displayed]

ACR Breast Density Category c: The breast tissue is heterogeneously
dense, which may obscure small masses.
FINDINGS: Spot compression CC and MLO views of the right breast are submitted.
Previously questioned mass persists on additional views.

Mammographic images were processed with CAD.

Targeted ultrasound is performed, showing a 0.84 cm simple cyst in
the right breast retroareolar 9 o'clock correlating to the
mammographic mass.
IMPRESSION: Benign findings.

RECOMMENDATION:
Routine screening mammogram back on schedule.

I have discussed the findings and recommendations with the patient.
Results were also provided in writing at the conclusion of the
visit. If applicable, a reminder letter will be sent to the patient
regarding the next appointment.

BI-RADS CATEGORY  2: Benign.

## 2018-02-06 ENCOUNTER — Ambulatory Visit: Payer: BLUE CROSS/BLUE SHIELD

## 2018-03-10 ENCOUNTER — Other Ambulatory Visit: Payer: Self-pay

## 2018-03-10 DIAGNOSIS — R7989 Other specified abnormal findings of blood chemistry: Secondary | ICD-10-CM

## 2018-03-10 NOTE — Addendum Note (Signed)
Addended by: Elouise Munroe on: 03/10/2018 12:44 PM   Modules accepted: Orders

## 2018-03-12 ENCOUNTER — Other Ambulatory Visit: Payer: Self-pay

## 2018-03-12 DIAGNOSIS — R7989 Other specified abnormal findings of blood chemistry: Secondary | ICD-10-CM

## 2018-03-13 ENCOUNTER — Other Ambulatory Visit: Payer: BLUE CROSS/BLUE SHIELD

## 2018-03-13 DIAGNOSIS — R7989 Other specified abnormal findings of blood chemistry: Secondary | ICD-10-CM

## 2018-03-14 LAB — THYROID PANEL WITH TSH
Free Thyroxine Index: 2 (ref 1.2–4.9)
T3 Uptake Ratio: 26 % (ref 24–39)
T4, Total: 7.5 ug/dL (ref 4.5–12.0)
TSH: 6.84 u[IU]/mL — ABNORMAL HIGH (ref 0.450–4.500)

## 2018-03-28 ENCOUNTER — Telehealth: Payer: Self-pay

## 2018-03-28 NOTE — Telephone Encounter (Signed)
Pt would like to come by and thank Dr. Keturah Barre for being a wonderful physician.   Pt will reach out to me with a day that is best for her.

## 2018-04-03 ENCOUNTER — Ambulatory Visit
Admission: RE | Admit: 2018-04-03 | Discharge: 2018-04-03 | Disposition: A | Discharge status: Home / Self Care | Payer: BLUE CROSS/BLUE SHIELD | Source: Ambulatory Visit | Attending: Obstetrics and Gynecology | Admitting: Obstetrics and Gynecology

## 2018-04-03 DIAGNOSIS — Z1239 Encounter for other screening for malignant neoplasm of breast: Secondary | ICD-10-CM | POA: Insufficient documentation

## 2018-04-03 DIAGNOSIS — Z1231 Encounter for screening mammogram for malignant neoplasm of breast: Secondary | ICD-10-CM | POA: Diagnosis not present

## 2018-04-07 IMAGING — US ULTRASOUND RIGHT BREAST LIMITED
1 series · 7 of 7 positions shown · non-contrast
Comparison: Previous exam(s).

CLINICAL DATA: Callback from screening mammogram for possible mass
right breast

EXAM:
2D DIGITAL DIAGNOSTIC RIGHT MAMMOGRAM WITH CAD AND ADJUNCT TOMO
ULTRASOUND RIGHT BREAST

[Series 1: ultrasound right breast limited · 0.07mm/px · 7 of 7 slices shown]
[im 1/7]
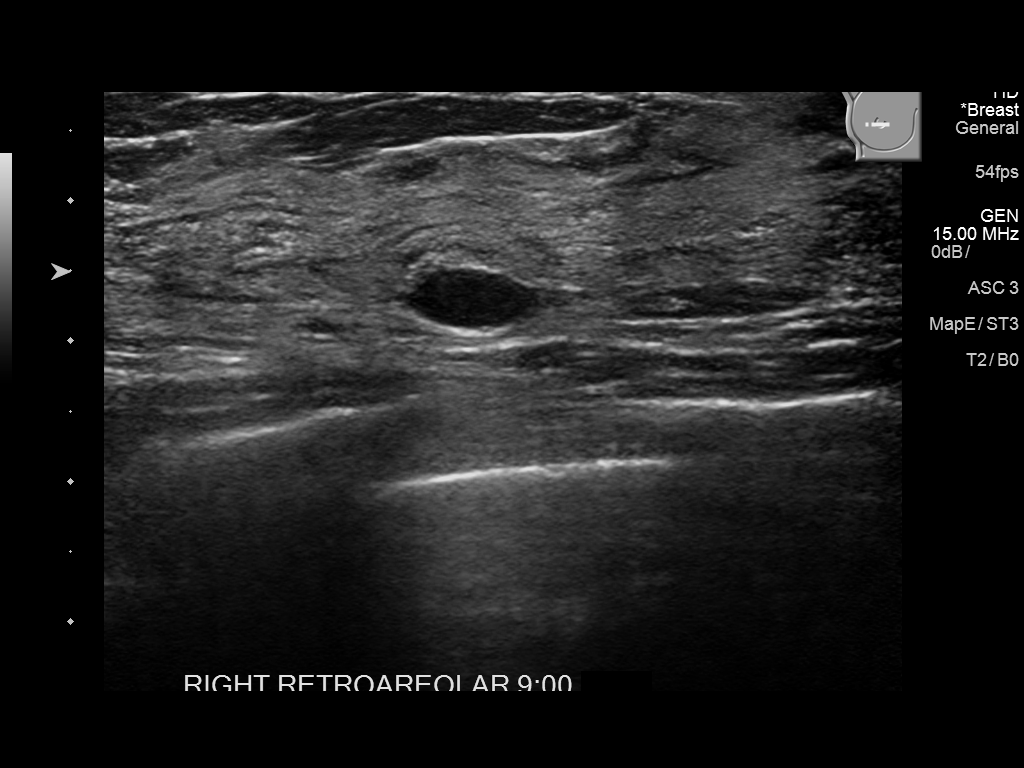
[im 2/7]
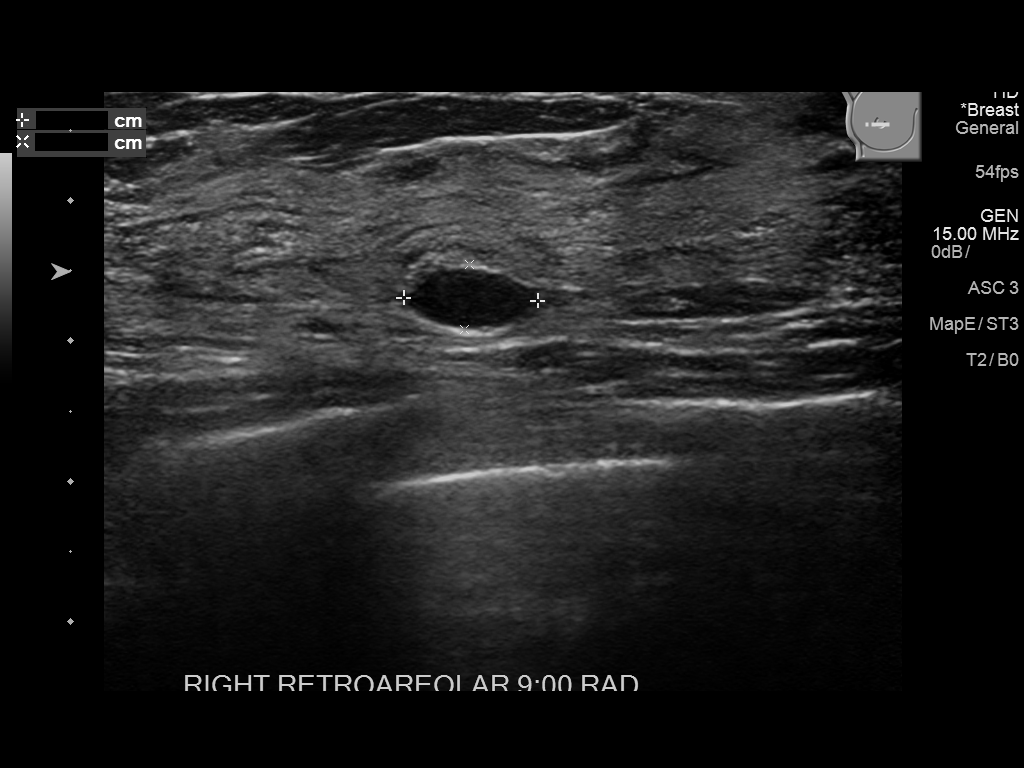
[im 3/7]
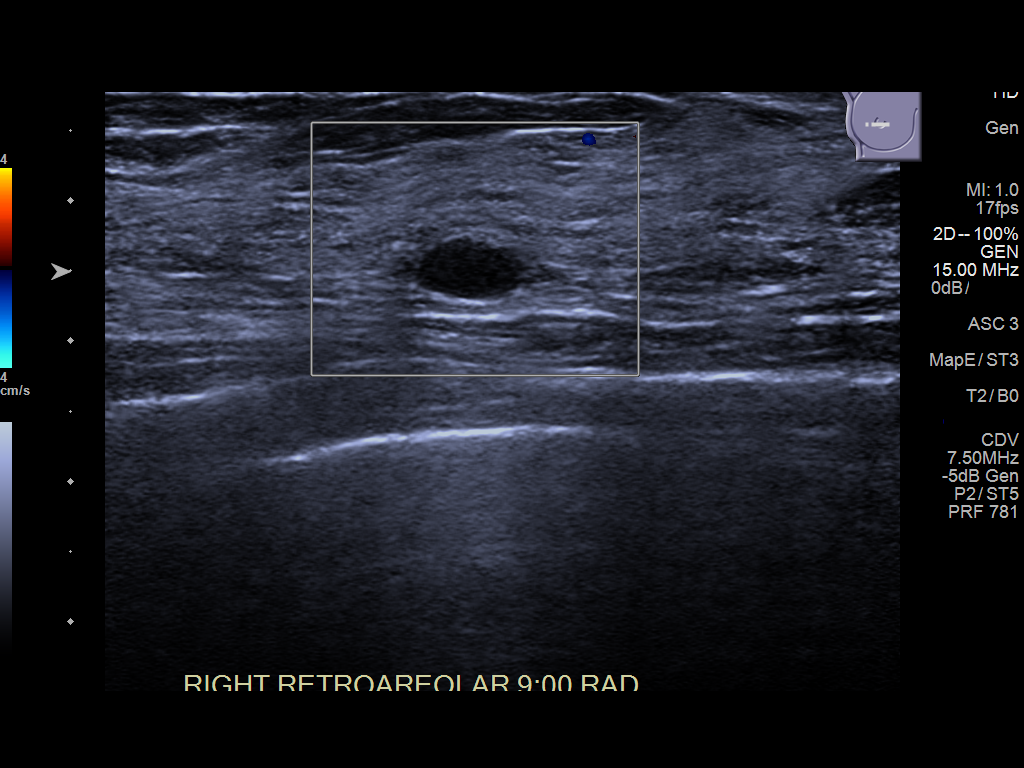
[im 4/7]
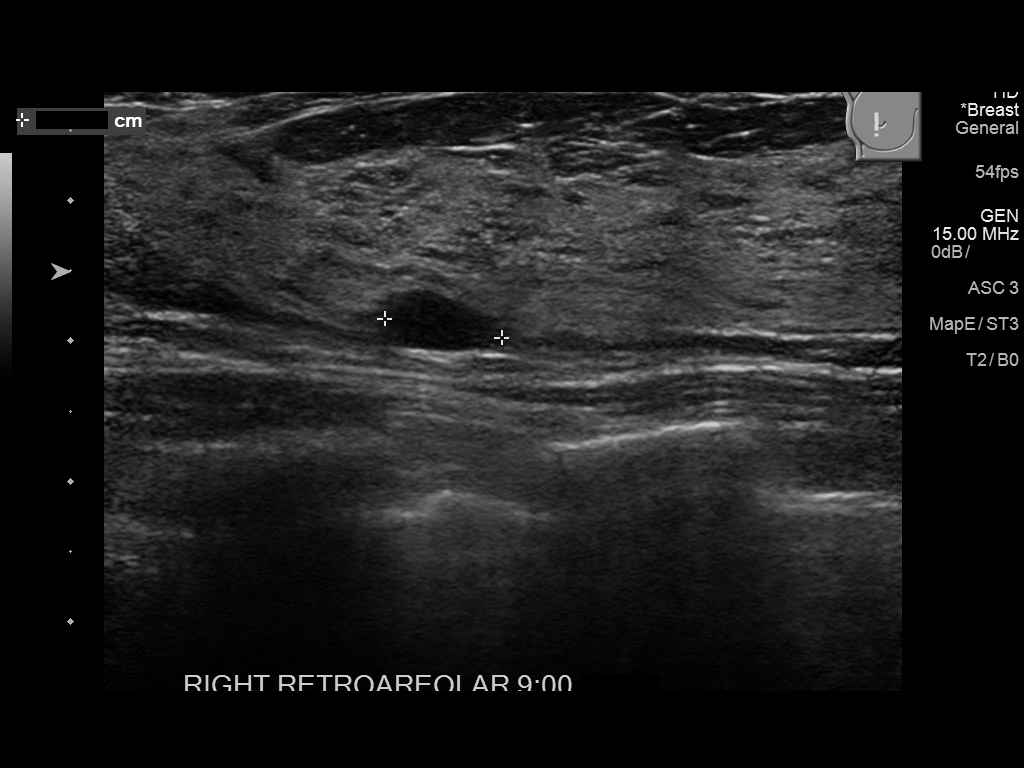
[im 5/7]
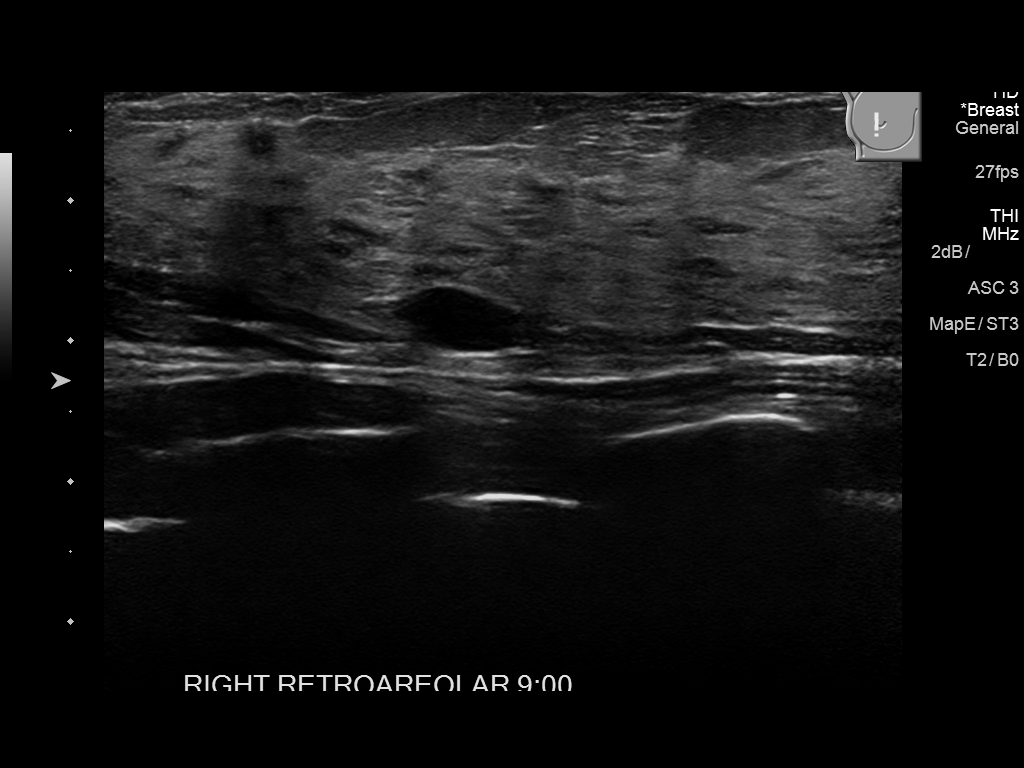
[im 6/7]
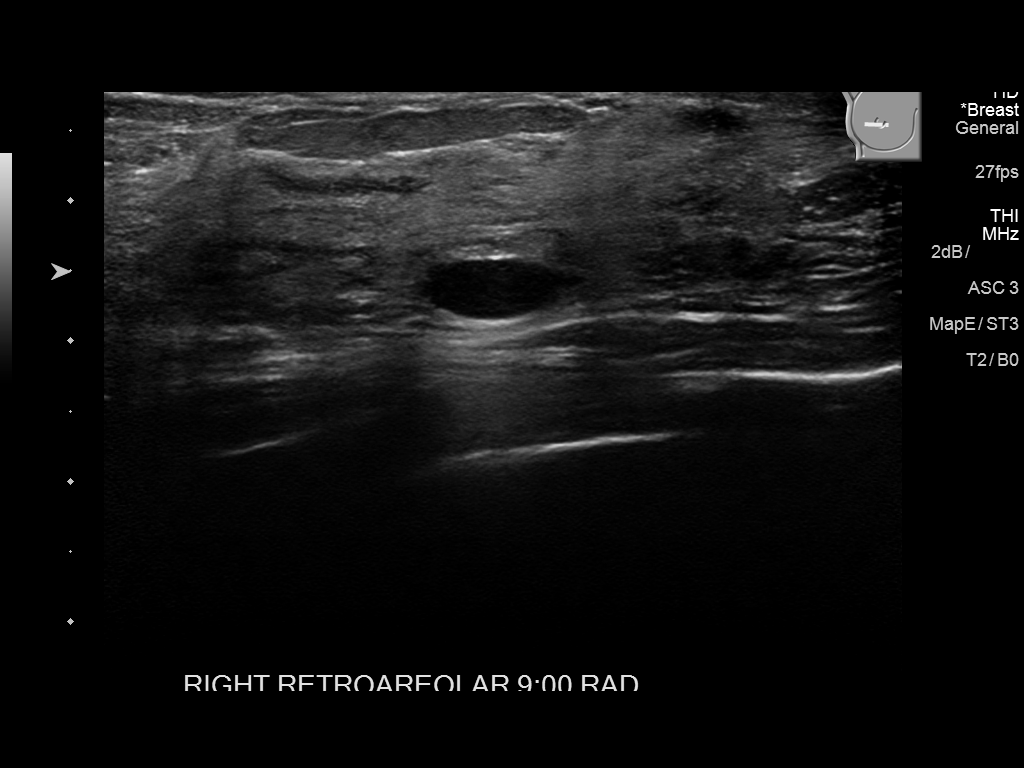
[im 7/7]
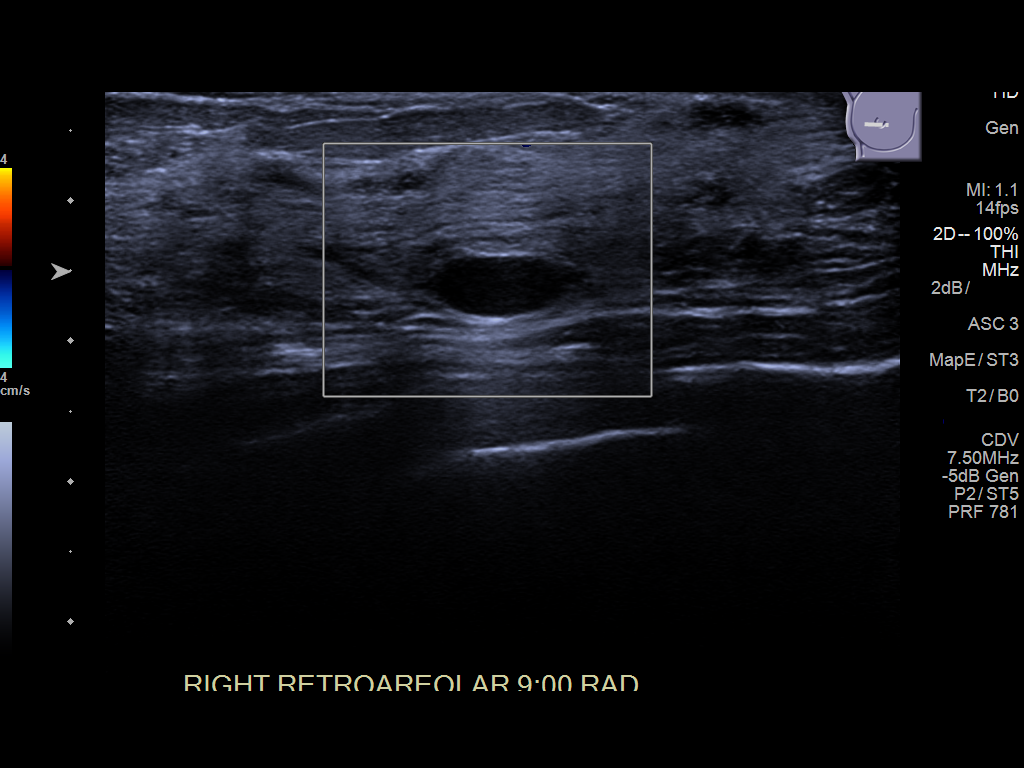

[7 of 7 positions shown; findings below may reference images not displayed]

ACR Breast Density Category c: The breast tissue is heterogeneously
dense, which may obscure small masses.
FINDINGS: Spot compression CC and MLO views of the right breast are submitted.
Previously questioned mass persists on additional views.

Mammographic images were processed with CAD.

Targeted ultrasound is performed, showing a 0.84 cm simple cyst in
the right breast retroareolar 9 o'clock correlating to the
mammographic mass.
IMPRESSION: Benign findings.

RECOMMENDATION:
Routine screening mammogram back on schedule.

I have discussed the findings and recommendations with the patient.
Results were also provided in writing at the conclusion of the
visit. If applicable, a reminder letter will be sent to the patient
regarding the next appointment.

BI-RADS CATEGORY  2: Benign.

## 2018-05-29 DIAGNOSIS — L821 Other seborrheic keratosis: Secondary | ICD-10-CM | POA: Diagnosis not present

## 2018-05-29 DIAGNOSIS — D2372 Other benign neoplasm of skin of left lower limb, including hip: Secondary | ICD-10-CM | POA: Diagnosis not present

## 2018-05-29 DIAGNOSIS — Z08 Encounter for follow-up examination after completed treatment for malignant neoplasm: Secondary | ICD-10-CM | POA: Diagnosis not present

## 2018-05-29 DIAGNOSIS — Z85828 Personal history of other malignant neoplasm of skin: Secondary | ICD-10-CM | POA: Diagnosis not present

## 2018-06-23 ENCOUNTER — Ambulatory Visit: Payer: BLUE CROSS/BLUE SHIELD | Admitting: Medical

## 2018-06-23 ENCOUNTER — Encounter: Payer: Self-pay | Admitting: Medical

## 2018-06-23 VITALS — BP 123/69 | HR 70 | Temp 97.7°F | Resp 16 | Wt 121.4 lb

## 2018-06-23 DIAGNOSIS — L089 Local infection of the skin and subcutaneous tissue, unspecified: Secondary | ICD-10-CM

## 2018-06-23 DIAGNOSIS — Z23 Encounter for immunization: Secondary | ICD-10-CM

## 2018-06-23 MED ORDER — AMOXICILLIN-POT CLAVULANATE 875-125 MG PO TABS: 1.0000 | ORAL_TABLET | Freq: Two times a day (BID) | ORAL | 0 refills | Status: DC

## 2018-06-23 NOTE — Progress Notes (Signed)
   Subjective:    Patient ID: Sharon Le, female    DOB: [birth year], 63 y.o.   MRN: [id number]   HPI  63 yo female in non acute distress.  2-3 week history of finger tips painful, possibly due to  a rash she has tried no new lotions or creams. , not sure. She does not get manicures. Denies fever or chills. Soaked them on Saturday which did help some.  Right 5th digit laterally  Thumb 4th and  5th digits at nail bases.  No itchy, but painful  Soaked them in epsom salt on Saturday. Has a history of gas sometimes with antibiotics she gets constipation. Blood pressure 123/69, pulse 70, temperature 97.7 F (36.5 C), temperature source Tympanic, resp. rate 16, weight 121 lb 6.4 oz (55.1 kg), SpO2 100 %. No Known Allergies    Review of Systems  Constitutional: Negative for chills, fatigue and fever.  Respiratory: Negative for cough.   Cardiovascular: Negative for chest pain.  Skin: Positive for color change (erythema see diagram) and rash.  Psychiatric/Behavioral: Negative for behavioral problems, self-injury and suicidal ideas.       Objective:   Physical Exam Vitals signs and nursing note reviewed.  Constitutional:      Appearance: Normal appearance.  HENT:     Head: Normocephalic and atraumatic.  Eyes:     Extraocular Movements: Extraocular movements intact.     Conjunctiva/sclera: Conjunctivae normal.     Pupils: Pupils are equal, round, and reactive to light.  Neck:     Musculoskeletal: Normal range of motion and neck supple.  Musculoskeletal: Normal range of motion.       Hands:  Skin:    General: Skin is warm.     Capillary Refill: Capillary refill takes less than 2 seconds.     Findings: Erythema and rash (possible) present.  Neurological:     General: No focal deficit present.     Mental Status: She is oriented to person, place, and time.  Psychiatric:        Mood and Affect: Mood normal.        Behavior: Behavior normal.        Thought Content: Thought  content normal.        Judgment: Judgment normal.           Assessment & Plan:  Skin infection right hand 1st , 4th  5th fingers, left hand  5th finger. Tdap up dated If painful take OTC Motrin or Tylenol per package.  Return in 3-5 days if not improving.  Patient verbalizes understanding and has no questions at discharge.

## 2018-07-10 ENCOUNTER — Other Ambulatory Visit: Payer: Self-pay

## 2018-07-10 MED ORDER — FLUCONAZOLE 150 MG PO TABS: 150.0000 mg | ORAL_TABLET | ORAL | 0 refills | Status: DC

## 2018-07-15 ENCOUNTER — Encounter: Payer: Self-pay | Admitting: Medical

## 2018-07-18 DIAGNOSIS — T691XXA Chilblains, initial encounter: Secondary | ICD-10-CM | POA: Diagnosis not present

## 2018-07-18 DIAGNOSIS — X31XXXA Exposure to excessive natural cold, initial encounter: Secondary | ICD-10-CM | POA: Diagnosis not present

## 2018-07-21 ENCOUNTER — Telehealth: Payer: Self-pay | Admitting: Obstetrics and Gynecology

## 2018-07-21 NOTE — Telephone Encounter (Signed)
The patient called and stated that she would like to speak with Joyice Faster. The patient is requesting a call back on her work number, after 1 pm if possible. Please advise.

## 2018-07-22 ENCOUNTER — Encounter: Payer: BLUE CROSS/BLUE SHIELD | Admitting: Obstetrics and Gynecology

## 2018-07-22 ENCOUNTER — Other Ambulatory Visit: Payer: Self-pay

## 2018-07-22 DIAGNOSIS — R7989 Other specified abnormal findings of blood chemistry: Secondary | ICD-10-CM

## 2018-07-22 DIAGNOSIS — T691XXA Chilblains, initial encounter: Secondary | ICD-10-CM

## 2018-07-22 MED ORDER — ESTRADIOL 0.025 MG/24HR TD PTTW: 1.0000 | MEDICATED_PATCH | TRANSDERMAL | 1 refills | Status: DC

## 2018-07-23 NOTE — Telephone Encounter (Signed)
See my chart message

## 2018-09-10 DIAGNOSIS — E039 Hypothyroidism, unspecified: Secondary | ICD-10-CM | POA: Diagnosis not present

## 2018-09-18 DIAGNOSIS — Z0000 Encounter for general adult medical examination without abnormal findings: Secondary | ICD-10-CM | POA: Diagnosis not present

## 2018-09-29 ENCOUNTER — Telehealth: Payer: Self-pay

## 2018-09-29 NOTE — Telephone Encounter (Signed)
There are 2 ways she can do this.  1) She can continue twice weekly dosing but cut the patch in half each time for 2 weeks, then discontinue use 2) She can continue to use one whole patch, but stretch the dosing out to 1 full week per patch (don't change the patch midweek). She can do this for 2 weeks and then discontinue use.  Dr. Marcelline Mates

## 2018-09-29 NOTE — Telephone Encounter (Signed)
Pt would like to wean off vivelle dot. She would like to know the best way to do that.   Pls advise. TY

## 2018-10-01 NOTE — Telephone Encounter (Signed)
Pt called no answer LM via voicemail to call the office to go over the information given by [facility].

## 2018-10-02 ENCOUNTER — Encounter: Payer: Self-pay | Admitting: Surgical

## 2018-10-02 NOTE — Telephone Encounter (Signed)
Spoke with patient and sent message to patient in my chart.

## 2018-10-27 ENCOUNTER — Other Ambulatory Visit: Payer: Self-pay | Admitting: Internal Medicine

## 2018-10-27 DIAGNOSIS — Z1231 Encounter for screening mammogram for malignant neoplasm of breast: Secondary | ICD-10-CM

## 2018-11-04 ENCOUNTER — Other Ambulatory Visit: Payer: Self-pay

## 2018-11-04 ENCOUNTER — Other Ambulatory Visit: Payer: BC Managed Care – PPO

## 2018-11-04 DIAGNOSIS — Z0000 Encounter for general adult medical examination without abnormal findings: Secondary | ICD-10-CM

## 2018-11-05 LAB — CBC WITH DIFFERENTIAL/PLATELET
Basophils Absolute: 0 10*3/uL (ref 0.0–0.2)
Basos: 1 %
EOS (ABSOLUTE): 0.1 10*3/uL (ref 0.0–0.4)
Eos: 2 %
Hematocrit: 37.8 % (ref 34.0–46.6)
Hemoglobin: 12.8 g/dL (ref 11.1–15.9)
Immature Grans (Abs): 0 10*3/uL (ref 0.0–0.1)
Immature Granulocytes: 0 %
Lymphocytes Absolute: 1.2 10*3/uL (ref 0.7–3.1)
Lymphs: 29 %
MCH: 32.7 pg (ref 26.6–33.0)
MCHC: 33.9 g/dL (ref 31.5–35.7)
MCV: 96 fL (ref 79–97)
Monocytes Absolute: 0.5 10*3/uL (ref 0.1–0.9)
Monocytes: 12 %
Neutrophils Absolute: 2.5 10*3/uL (ref 1.4–7.0)
Neutrophils: 56 %
Platelets: 211 10*3/uL (ref 150–450)
RBC: 3.92 x10E6/uL (ref 3.77–5.28)
RDW: 12.2 % (ref 11.7–15.4)
WBC: 4.4 10*3/uL (ref 3.4–10.8)

## 2018-11-05 LAB — URINALYSIS, ROUTINE W REFLEX MICROSCOPIC
Bilirubin, UA: NEGATIVE
Glucose, UA: NEGATIVE
Ketones, UA: NEGATIVE
Nitrite, UA: NEGATIVE
Protein,UA: NEGATIVE
RBC, UA: NEGATIVE
Specific Gravity, UA: 1.009 (ref 1.005–1.030)
Urobilinogen, Ur: 0.2 mg/dL (ref 0.2–1.0)
pH, UA: 7 (ref 5.0–7.5)

## 2018-11-05 LAB — COMPREHENSIVE METABOLIC PANEL
ALT: 10 IU/L (ref 0–32)
AST: 15 IU/L (ref 0–40)
Albumin/Globulin Ratio: 2.5 — ABNORMAL HIGH (ref 1.2–2.2)
Albumin: 4.2 g/dL (ref 3.8–4.8)
Alkaline Phosphatase: 63 IU/L (ref 39–117)
BUN/Creatinine Ratio: 16 (ref 12–28)
BUN: 14 mg/dL (ref 8–27)
Bilirubin Total: 0.5 mg/dL (ref 0.0–1.2)
CO2: 22 mmol/L (ref 20–29)
Calcium: 8.9 mg/dL (ref 8.7–10.3)
Chloride: 101 mmol/L (ref 96–106)
Creatinine, Ser: 0.86 mg/dL (ref 0.57–1.00)
GFR calc Af Amer: 84 mL/min/{1.73_m2} (ref >59)
GFR calc non Af Amer: 73 mL/min/{1.73_m2} (ref >59)
Globulin, Total: 1.7 g/dL (ref 1.5–4.5)
Glucose: 104 mg/dL — ABNORMAL HIGH (ref 65–99)
Potassium: 4.4 mmol/L (ref 3.5–5.2)
Sodium: 139 mmol/L (ref 134–144)
Total Protein: 5.9 g/dL — ABNORMAL LOW (ref 6.0–8.5)

## 2018-11-05 LAB — VITAMIN B12: Vitamin B-12: 234 pg/mL (ref 232–1245)

## 2018-11-05 LAB — LIPID PANEL
Chol/HDL Ratio: 2.5 ratio (ref 0.0–4.4)
Cholesterol, Total: 160 mg/dL (ref 100–199)
HDL: 65 mg/dL (ref >39)
LDL Calculated: 86 mg/dL (ref 0–99)
Triglycerides: 43 mg/dL (ref 0–149)
VLDL Cholesterol Cal: 9 mg/dL (ref 5–40)

## 2018-11-05 LAB — MICROSCOPIC EXAMINATION: Casts: NONE SEEN /lpf

## 2018-11-05 LAB — VITAMIN D 25 HYDROXY (VIT D DEFICIENCY, FRACTURES): Vit D, 25-Hydroxy: 40.6 ng/mL (ref 30.0–100.0)

## 2018-11-05 LAB — TSH: TSH: 0.415 u[IU]/mL — ABNORMAL LOW (ref 0.450–4.500)

## 2018-11-19 DIAGNOSIS — E039 Hypothyroidism, unspecified: Secondary | ICD-10-CM | POA: Diagnosis not present

## 2019-01-13 ENCOUNTER — Other Ambulatory Visit: Payer: Self-pay

## 2019-01-13 ENCOUNTER — Other Ambulatory Visit: Payer: BC Managed Care – PPO

## 2019-01-13 DIAGNOSIS — E039 Hypothyroidism, unspecified: Secondary | ICD-10-CM

## 2019-01-14 LAB — T4, FREE: Free T4: 1.58 ng/dL (ref 0.82–1.77)

## 2019-01-14 LAB — TSH: TSH: 0.401 u[IU]/mL — ABNORMAL LOW (ref 0.450–4.500)

## 2019-01-28 ENCOUNTER — Other Ambulatory Visit: Payer: Self-pay

## 2019-01-28 ENCOUNTER — Ambulatory Visit: Payer: BC Managed Care – PPO

## 2019-01-28 DIAGNOSIS — Z23 Encounter for immunization: Secondary | ICD-10-CM

## 2019-02-03 ENCOUNTER — Encounter: Payer: Self-pay | Admitting: Obstetrics and Gynecology

## 2019-02-03 ENCOUNTER — Other Ambulatory Visit: Payer: Self-pay

## 2019-02-03 ENCOUNTER — Ambulatory Visit (INDEPENDENT_AMBULATORY_CARE_PROVIDER_SITE_OTHER): Payer: BC Managed Care – PPO | Admitting: Obstetrics and Gynecology

## 2019-02-03 VITALS — BP 151/70 | HR 65 | Ht 65.0 in | Wt 122.4 lb

## 2019-02-03 DIAGNOSIS — E039 Hypothyroidism, unspecified: Secondary | ICD-10-CM | POA: Diagnosis not present

## 2019-02-03 DIAGNOSIS — R6889 Other general symptoms and signs: Secondary | ICD-10-CM | POA: Diagnosis not present

## 2019-02-03 DIAGNOSIS — E038 Other specified hypothyroidism: Secondary | ICD-10-CM

## 2019-02-03 DIAGNOSIS — R030 Elevated blood-pressure reading, without diagnosis of hypertension: Secondary | ICD-10-CM

## 2019-02-03 DIAGNOSIS — N951 Menopausal and female climacteric states: Secondary | ICD-10-CM

## 2019-02-03 MED ORDER — ESTRADIOL 0.025 MG/24HR TD PTTW: 1.0000 | MEDICATED_PATCH | TRANSDERMAL | 3 refills | Status: DC

## 2019-02-03 NOTE — Progress Notes (Signed)
GYNECOLOGY PROGRESS NOTE  Subjective:    Patient ID: Sharon Le, female    DOB: 01/14/1956, 63 y.o.   MRN: [id number]  HPI  Patient is a 63 y.o. G0P0000 female who presents for menopausal vasomotor symptoms.  Patient was previously seeing Dr. Hassell Done DeFrancesco, who has retired from the practice.She was last seen in February 2019. She has been on HRT for ~ 5-7 years (cannot recall exactly when she started), however ran out of her medication in June. She states that she established care with a PCP (which she has never really had), who has alongside Endocrinology been performing a workup for patient's cold intolerance and constipation (symptoms since January).  Notes that she she has had borderline thyroid levels for years. The Endocrinologist noted no reason for treatment but PCP decided to try treatment to help her symptoms. Patient states that they have been adjusting her dosage as her levels have gone in the opposite direction. Still not really noting any relief of her symptoms.    Additionally, she has been noting hot flushes and night seats  throughout the day and not being able to sleep at night.  Notes she discussed her symptoms with her PCP, who refused to reinitiate her hormonal therapy. Patient presents today to discuss her options.    The following portions of the patient's history were reviewed and updated as appropriate:   She  has a past medical history of Abnormal TSH, Adenomatous colon polyp, Anemia, Basal cell carcinoma, Complication of anesthesia, Decreased fibrinogen ([facility]), Elevated TSH, Family history of adverse reaction to anesthesia, Fatigue, History of kidney stones, MVP (mitral valve prolapse), Osteopenia, Retinal tear, Rosacea, Vasomotor instability, and Yeast infection.   She  has a past surgical history that includes Excision basal cell carcinoma (abd); Colonoscopy with propofol (N/A, 10/05/2015); Wisdom tooth extraction; Vaginal hysterectomy (2010); Breast ductal  system excision (Left, 06/07/2017); and Breast excisional biopsy (Left, 06/07/2017).   Her family history includes Breast cancer (age of onset: 19) in her maternal aunt; Colon cancer in her paternal aunt; Heart disease in her maternal grandmother; Vascular Disease in her father.   She  reports that she has never smoked. She has never used smokeless tobacco. She reports that she does not drink alcohol or use drugs.   She has a current medication list which includes the following prescription(s): aspirin-acetaminophen-caffeine, azelaic acid, olopatadine, amoxicillin-clavulanate, docusate calcium, estradiol, and fluconazole.   She has No Known Allergies..   Review of Systems Pertinent items noted in HPI and remainder of comprehensive ROS otherwise negative.   Objective:   Blood pressure (!) 151/70, pulse 65, height 5\' 5"  (1.651 m), weight 122 lb 6.4 oz (55.5 kg). General appearance: alert and no distress Remainder of exam deferred today.    Assessment:   Menopausal vasomotor syndrome Cold intolerance Elevated blood pressure, situational Subclinical hypothyroidism  Plan:   1. Patient with bothersome menopausal vasomotor symptoms. Discussed lifestyle interventions such as wearing light clothing, remaining in cool environments, having fan/air conditioner in the room, avoiding hot beverages etc.  Discussed using hormone therapy and concerns about increased risk of heart disease, cerebrovascular disease, thromboembolic disease,  and breast cancer.  Also discussed other medical options such as Paxil, Effexor, Brisdelle, Clonidine,  or Neurontin.   Also discussed alternative therapies such as herbal remedies but cautioned that most of the products contained phytoestrogens (plant estrogens) in unregulated amounts which can have the same effects on the body as the pharmaceutical estrogen preparations. This patient has had a  5-7 year history of HRT exposure with new study data showing increased risk  of thrombo-embolic events such as myocardial infarction stroke and breast cancer after 4 or more years exposure to combination products with estrogen and progesterone. She has been counseled today about continued use of hormonal replacement therapy, and understands the benefits as well as the risks discussed above. She  is advised that she may wish to discontinue the HRT at any time,  Her personal risk factors have been reviewed carefully with her today. Will reinitiate hormone therapy with Premarin oral tablets.  2. Cold intolerance (and constipation) thought to be due to subclinical hypothyroidism, currently on thyroid replacement.  Continue to follow up with PCP for titration of medications.  3. Elevated BP noted today, patient denies any history of HTN. Notes she was nervous about today's visit and meeting a new provider. Will continue to monitor.  4. Patient to return in 4-5 months for annual exam. To notify MD sooner if vasomotor symptoms do not improve with addition of HRT.    A total of 25 minutes were spent face-to-face with the patient during this encounter and over half of that time involved counseling and coordination of care.   Rubie Maid, MD [facility]

## 2019-02-03 NOTE — Progress Notes (Signed)
Pt is present for menopausal symptoms. Pt stated having night sweats, hot flashes throughout the day and not able to sleep at night. Pt has not took estradiol patches since June 2020.

## 2019-02-03 NOTE — Patient Instructions (Signed)
Menopause and Hormone Replacement Therapy Menopause is a normal time of life when menstrual periods stop completely and the ovaries stop producing the female hormones estrogen and progesterone. This lack of hormones can affect your health and cause undesirable symptoms. Hormone replacement therapy (HRT) can relieve some of those symptoms. What is hormone replacement therapy? HRT is the use of artificial (synthetic) hormones to replace hormones that your body has stopped producing because you have reached menopause. What are my options for HRT?  HRT may consist of the synthetic hormones estrogen and progestin, or it may consist of only estrogen (estrogen-only therapy). You and your health care provider will decide which form of HRT is best for you. If you choose to be on HRT and you have a uterus, estrogen and progestin are usually prescribed. Estrogen-only therapy is used for women who do not have a uterus. Possible options for taking HRT include:  Pills.  Patches.  Gels.  Sprays.  Vaginal cream.  Vaginal rings.  Vaginal inserts. The amount of hormone(s) that you take and how long you take the hormone(s) varies according to your health. It is important to:  Begin HRT with the lowest possible dosage.  Stop HRT as soon as your health care provider tells you to stop.  Work with your health care provider so that you feel informed and comfortable with your decisions. What are the benefits of HRT? HRT can reduce the frequency and severity of menopausal symptoms. Benefits of HRT vary according to the kind of symptoms that you have, how severe they are, and your overall health. HRT may help to improve the following symptoms of menopause:  Hot flashes and night sweats. These are sudden feelings of heat that spread over the face and body. The skin may turn red, like a blush. Night sweats are hot flashes that happen while you are sleeping or trying to sleep.  Bone loss (osteoporosis). The  body loses calcium more quickly after menopause, causing the bones to become weaker. This can increase the risk for bone breaks (fractures).  Vaginal dryness. The lining of the vagina can become thin and dry, which can cause pain during sex or cause infection, burning, or itching.  Urinary tract infections.  Urinary incontinence. This is the inability to control when you pass urine.  Irritability.  Short-term memory problems. What are the risks of HRT? Risks of HRT vary depending on your individual health and medical history. Risks of HRT also depend on whether you receive both estrogen and progestin or you receive estrogen only. HRT may increase the risk of:  Spotting. This is when a small amount of blood leaks from the vagina unexpectedly.  Endometrial cancer. This cancer is in the lining of the uterus (endometrium).  Breast cancer.  Increased density of breast tissue. This can make it harder to find breast cancer on a breast X-ray (mammogram).  Stroke.  Heart disease.  Blood clots.  Gallbladder disease.  Liver disease. Risks of HRT can increase if you have any of the following conditions:  Endometrial cancer.  Liver disease.  Heart disease.  Breast cancer.  History of blood clots.  History of stroke. Follow these instructions at home:  Take over-the-counter and prescription medicines only as told by your health care provider.  Get mammograms, pelvic exams, and medical checkups as often as told by your health care provider.  Have Pap tests done as often as told by your health care provider. A Pap test is sometimes called a Pap smear. It   is a screening test that is used to check for signs of cancer of the cervix and vagina. A Pap test can also identify the presence of infection or precancerous changes. Pap tests may be done: ? Every 3 years, starting at age 21. ? Every 5 years, starting after age 30, in combination with testing for human papillomavirus (HPV). ?  More often or less often depending on other medical conditions you have, your age, and other risk factors.  It is up to you to get the results of your Pap test. Ask your health care provider, or the department that is doing the test, when your results will be ready.  Keep all follow-up visits as told by your health care provider. This is important. Contact a health care provider if you have:  Pain or swelling in your legs.  Shortness of breath.  Chest pain.  Lumps or changes in your breasts or armpits.  Slurred speech.  Pain, burning, or bleeding when you urinate.  Unusual vaginal bleeding.  Dizziness or headaches.  Weakness or numbness in any part of your arms or legs.  Pain in your abdomen. Summary  Menopause is a normal time of life when menstrual periods stop completely and the ovaries stop producing the female hormones estrogen and progesterone.  Hormone replacement therapy (HRT) can relieve some of the symptoms of menopause.  HRT can reduce the frequency and severity of menopausal symptoms.  Risks of HRT vary depending on your individual health and medical history. This information is not intended to replace advice given to you by your health care provider. Make sure you discuss any questions you have with your health care provider. Document Released: 02/03/2003 Document Revised: 01/07/2018 Document Reviewed: 01/07/2018 Elsevier Patient Education  2020 [employer]  

## 2019-04-07 ENCOUNTER — Ambulatory Visit
Admission: RE | Admit: 2019-04-07 | Discharge: 2019-04-07 | Disposition: A | Discharge status: Home / Self Care | Payer: BC Managed Care – PPO | Source: Ambulatory Visit | Attending: Internal Medicine | Admitting: Internal Medicine

## 2019-04-07 DIAGNOSIS — Z1231 Encounter for screening mammogram for malignant neoplasm of breast: Secondary | ICD-10-CM | POA: Diagnosis not present

## 2019-05-29 DIAGNOSIS — Z85828 Personal history of other malignant neoplasm of skin: Secondary | ICD-10-CM | POA: Diagnosis not present

## 2019-05-29 DIAGNOSIS — D2261 Melanocytic nevi of right upper limb, including shoulder: Secondary | ICD-10-CM | POA: Diagnosis not present

## 2019-05-29 DIAGNOSIS — D2271 Melanocytic nevi of right lower limb, including hip: Secondary | ICD-10-CM | POA: Diagnosis not present

## 2019-05-29 DIAGNOSIS — D2262 Melanocytic nevi of left upper limb, including shoulder: Secondary | ICD-10-CM | POA: Diagnosis not present

## 2019-05-29 DIAGNOSIS — D485 Neoplasm of uncertain behavior of skin: Secondary | ICD-10-CM | POA: Diagnosis not present

## 2019-05-29 DIAGNOSIS — L438 Other lichen planus: Secondary | ICD-10-CM | POA: Diagnosis not present

## 2019-07-06 NOTE — Patient Instructions (Addendum)
Preventive Care 40-64 Years Old, Female Preventive care refers to visits with your health care provider and lifestyle choices that can promote health and wellness. This includes:  A yearly physical exam. This may also be called an annual well check.  Regular dental visits and eye exams.  Immunizations.  Screening for certain conditions.  Healthy lifestyle choices, such as eating a healthy diet, getting regular exercise, not using drugs or products that contain nicotine and tobacco, and limiting alcohol use. What can I expect for my preventive care visit? Physical exam Your health care provider will check your:  Height and weight. This may be used to calculate body mass index (BMI), which tells if you are at a healthy weight.  Heart rate and blood pressure.  Skin for abnormal spots. Counseling Your health care provider may ask you questions about your:  Alcohol, tobacco, and drug use.  Emotional well-being.  Home and relationship well-being.  Sexual activity.  Eating habits.  Work and work environment.  Method of birth control.  Menstrual cycle.  Pregnancy history. What immunizations do I need?  Influenza (flu) vaccine  This is recommended every year. Tetanus, diphtheria, and pertussis (Tdap) vaccine  You may need a Td booster every 10 years. Varicella (chickenpox) vaccine  You may need this if you have not been vaccinated. Zoster (shingles) vaccine  You may need this after age 60. Measles, mumps, and rubella (MMR) vaccine  You may need at least one dose of MMR if you were born in 1957 or later. You may also need a second dose. Pneumococcal conjugate (PCV13) vaccine  You may need this if you have certain conditions and were not previously vaccinated. Pneumococcal polysaccharide (PPSV23) vaccine  You may need one or two doses if you smoke cigarettes or if you have certain conditions. Meningococcal conjugate (MenACWY) vaccine  You may need this if you  have certain conditions. Hepatitis A vaccine  You may need this if you have certain conditions or if you travel or work in places where you may be exposed to hepatitis A. Hepatitis B vaccine  You may need this if you have certain conditions or if you travel or work in places where you may be exposed to hepatitis B. Haemophilus influenzae type b (Hib) vaccine  You may need this if you have certain conditions. Human papillomavirus (HPV) vaccine  If recommended by your health care provider, you may need three doses over 6 months. You may receive vaccines as individual doses or as more than one vaccine together in one shot (combination vaccines). Talk with your health care provider about the risks and benefits of combination vaccines. What tests do I need? Blood tests  Lipid and cholesterol levels. These may be checked every 5 years, or more frequently if you are over 50 years old.  Hepatitis C test.  Hepatitis B test. Screening  Lung cancer screening. You may have this screening every year starting at age 55 if you have a 30-pack-year history of smoking and currently smoke or have quit within the past 15 years.  Colorectal cancer screening. All adults should have this screening starting at age 50 and continuing until age 75. Your health care provider may recommend screening at age 45 if you are at increased risk. You will have tests every 1-10 years, depending on your results and the type of screening test.  Diabetes screening. This is done by checking your blood sugar (glucose) after you have not eaten for a while (fasting). You may have this   done every 1-3 years.  Mammogram. This may be done every 1-2 years. Talk with your health care provider about when you should start having regular mammograms. This may depend on whether you have a family history of breast cancer.  BRCA-related cancer screening. This may be done if you have a family history of breast, ovarian, tubal, or peritoneal  cancers.  Pelvic exam and Pap test. This may be done every 3 years starting at age 70. Starting at age 87, this may be done every 5 years if you have a Pap test in combination with an HPV test. Other tests  Sexually transmitted disease (STD) testing.  Bone density scan. This is done to screen for osteoporosis. You may have this scan if you are at high risk for osteoporosis. Follow these instructions at home: Eating and drinking  Eat a diet that includes fresh fruits and vegetables, whole grains, lean protein, and low-fat dairy.  Take vitamin and mineral supplements as recommended by your health care provider.  Do not drink alcohol if: ? Your health care provider tells you not to drink. ? You are pregnant, may be pregnant, or are planning to become pregnant.  If you drink alcohol: ? Limit how much you have to 0-1 drink a day. ? Be aware of how much alcohol is in your drink. In the [place] one drink equals one 12 oz bottle of beer (355 mL), one 5 oz glass of wine (148 mL), or one 1 oz glass of hard liquor (44 mL). Lifestyle  Take daily care of your teeth and gums.  Stay active. Exercise for at least 30 minutes on 5 or more days each week.  Do not use any products that contain nicotine or tobacco, such as cigarettes, e-cigarettes, and chewing tobacco. If you need help quitting, ask your health care provider.  If you are sexually active, practice safe sex. Use a condom or other form of birth control (contraception) in order to prevent pregnancy and STIs (sexually transmitted infections).  If told by your health care provider, take low-dose aspirin daily starting at age 59. What's next?  Visit your health care provider once a year for a well check visit.  Ask your health care provider how often you should have your eyes and teeth checked.  Stay up to date on all vaccines. This information is not intended to replace advice given to you by your health care provider. Make sure you  discuss any questions you have with your health care provider. Document Revised: 01/16/2018 Document Reviewed: 01/16/2018 Elsevier Patient Education  2020 [employer] Breast self-awareness is knowing how your breasts look and feel. Doing breast self-awareness is important. It allows you to catch a breast problem early while it is still small and can be treated. All women should do breast self-awareness, including women who have had breast implants. Tell your doctor if you notice a change in your breasts. What you need:  A mirror.  A well-lit room. How to do a breast self-exam A breast self-exam is one way to learn what is normal for your breasts and to check for changes. To do a breast self-exam: Look for changes  1. Take off all the clothes above your waist. 2. Stand in front of a mirror in a room with good lighting. 3. Put your hands on your hips. 4. Push your hands down. 5. Look at your breasts and nipples in the mirror to see if one breast or nipple looks different  other. Check to see if: ? The shape of one breast is different. ? The size of one breast is different. ? There are wrinkles, dips, and bumps in one breast and not the other. 6. Look at each breast for changes in the skin, such as: ? Redness. ? Scaly areas. 7. Look for changes in your nipples, such as: ? Liquid around the nipples. ? Bleeding. ? Dimpling. ? Redness. ? A change in where the nipples are. Feel for changes  1. Lie on your back on the floor. 2. Feel each breast. To do this, follow these steps: ? Pick a breast to feel. ? Put the arm closest to that breast above your head. ? Use your other arm to feel the nipple area of your breast. Feel the area with the pads of your three middle fingers by making small circles with your fingers. For the first circle, press lightly. For the second circle, press harder. For the third circle, press even harder. ? Keep making circles with  your fingers at the different pressures as you move down your breast. Stop when you feel your ribs. ? Move your fingers a little toward the center of your body. ? Start making circles with your fingers again, this time going up until you reach your collarbone. ? Keep making up-and-down circles until you reach your armpit. Remember to keep using the three pressures. ? Feel the other breast in the same way. 3. Sit or stand in the tub or shower. 4. With soapy water on your skin, feel each breast the same way you did in step 2 when you were lying on the floor. Write down what you find Writing down what you find can help you remember what to tell your doctor. Write down:  What is normal for each breast.  Any changes you find in each breast, including: ? The kind of changes you find. ? Whether you have pain. ? Size and location of any lumps.  When you last had your menstrual period. General tips  Check your breasts every month.  If you are breastfeeding, the best time to check your breasts is after you feed your baby or after you use a breast pump.  If you get menstrual periods, the best time to check your breasts is 5-7 days after your menstrual period is over.  With time, you will become comfortable with the self-exam, and you will begin to know if there are changes in your breasts. Contact a doctor if you:  See a change in the shape or size of your breasts or nipples.  See a change in the skin of your breast or nipples, such as red or scaly skin.  Have fluid coming from your nipples that is not normal.  Find a lump or thick area that was not there before.  Have pain in your breasts.  Have any concerns about your breast health. Summary  Breast self-awareness includes looking for changes in your breasts, as well as feeling for changes within your breasts.  Breast self-awareness should be done in front of a mirror in a well-lit room.  You should check your breasts every month.  If you get menstrual periods, the best time to check your breasts is 5-7 days after your menstrual period is over.  Let your doctor know of any changes you see in your breasts, including changes in size, changes on the skin, pain or tenderness, or fluid from your nipples that is not normal. This information is not   is not intended to replace advice given to you by your health care provider. Make sure you discuss any questions you have with your health care provider. Document Revised: 12/24/2017 Document Reviewed: 12/24/2017 Elsevier Patient Education  [place].    Constipation, Adult Constipation is when a person has fewer bowel movements in a week than normal, has difficulty having a bowel movement, or has stools that are dry, hard, or larger than normal. Constipation may be caused by an underlying condition. It may become worse with age if a person takes certain medicines and does not take in enough fluids. Follow these instructions at home: Eating and drinking   Eat foods that have a lot of fiber, such as fresh fruits and vegetables, whole grains, and beans.  Limit foods that are high in fat, low in fiber, or overly processed, such as french fries, hamburgers, cookies, candies, and soda.  Drink enough fluid to keep your urine clear or pale yellow. General instructions  Exercise regularly or as told by your health care provider.  Go to the restroom when you have the urge to go. Do not hold it in.  Take over-the-counter and prescription medicines only as told by your health care provider. These include any fiber supplements.  Practice pelvic floor retraining exercises, such as deep breathing while relaxing the lower abdomen and pelvic floor relaxation during bowel movements.  Watch your condition for any changes.  Keep all follow-up visits as told by your health care provider. This is important. Contact a health care provider if:  You have pain that gets worse.  You  have a fever.  You do not have a bowel movement after 4 days.  You vomit.  You are not hungry.  You lose weight.  You are bleeding from the anus.  You have thin, pencil-like stools. Get help right away if:  You have a fever and your symptoms suddenly get worse.  You leak stool or have blood in your stool.  Your abdomen is bloated.  You have severe pain in your abdomen.  You feel dizzy or you faint. This information is not intended to replace advice given to you by your health care provider. Make sure you discuss any questions you have with your health care provider. Document Revised: 04/19/2017 Document Reviewed: 10/26/2015 Elsevier Patient Education  [place].    Kegel Exercises  Kegel exercises can help strengthen your pelvic floor muscles. The pelvic floor is a group of muscles that support your rectum, small intestine, and bladder. In females, pelvic floor muscles also help support the womb (uterus). These muscles help you control the flow of urine and stool. Kegel exercises are painless and simple, and they do not require any equipment. Your provider may suggest Kegel exercises to:  Improve bladder and bowel control.  Improve sexual response.  Improve weak pelvic floor muscles after surgery to remove the uterus (hysterectomy) or pregnancy (females).  Improve weak pelvic floor muscles after prostate gland removal or surgery (males). Kegel exercises involve squeezing your pelvic floor muscles, which are the same muscles you squeeze when you try to stop the flow of urine or keep from passing gas. The exercises can be done while sitting, standing, or lying down, but it is best to vary your position. Exercises How to do Kegel exercises: 1. Squeeze your pelvic floor muscles tight. You should feel a tight lift in your rectal area. If you are a female, you should also feel a tightness in your vaginal area.  Keep your stomach, buttocks, and legs relaxed. 2. Hold the  muscles tight for up to 10 seconds. 3. Breathe normally. 4. Relax your muscles. 5. Repeat as told by your health care provider. Repeat this exercise daily as told by your health care provider. Continue to do this exercise for at least 4-6 weeks, or for as long as told by your health care provider. You may be referred to a physical therapist who can help you learn more about how to do Kegel exercises. Depending on your condition, your health care provider may recommend:  Varying how long you squeeze your muscles.  Doing several sets of exercises every day.  Doing exercises for several weeks.  Making Kegel exercises a part of your regular exercise routine. This information is not intended to replace advice given to you by your health care provider. Make sure you discuss any questions you have with your health care provider. Document Revised: 12/25/2017 Document Reviewed: 12/25/2017 Elsevier Patient Education  [place].

## 2019-07-06 NOTE — Progress Notes (Signed)
Pt present for annual exam. Pt stated having constipation, gas and thyroid issues. Pt stated that she went to several doctors and was placed on synthroid and stopped taking the medication due to having swollen legs.

## 2019-07-07 ENCOUNTER — Ambulatory Visit (INDEPENDENT_AMBULATORY_CARE_PROVIDER_SITE_OTHER): Payer: BC Managed Care – PPO | Admitting: Obstetrics and Gynecology

## 2019-07-07 ENCOUNTER — Encounter: Payer: Self-pay | Admitting: Obstetrics and Gynecology

## 2019-07-07 ENCOUNTER — Other Ambulatory Visit: Payer: Self-pay

## 2019-07-07 VITALS — BP 145/72 | HR 79 | Ht 65.0 in | Wt 126.2 lb

## 2019-07-07 DIAGNOSIS — Z1231 Encounter for screening mammogram for malignant neoplasm of breast: Secondary | ICD-10-CM

## 2019-07-07 DIAGNOSIS — Z01419 Encounter for gynecological examination (general) (routine) without abnormal findings: Secondary | ICD-10-CM

## 2019-07-07 DIAGNOSIS — E038 Other specified hypothyroidism: Secondary | ICD-10-CM

## 2019-07-07 DIAGNOSIS — R143 Flatulence: Secondary | ICD-10-CM

## 2019-07-07 DIAGNOSIS — E039 Hypothyroidism, unspecified: Secondary | ICD-10-CM

## 2019-07-07 DIAGNOSIS — N951 Menopausal and female climacteric states: Secondary | ICD-10-CM

## 2019-07-07 DIAGNOSIS — K5904 Chronic idiopathic constipation: Secondary | ICD-10-CM | POA: Diagnosis not present

## 2019-07-07 DIAGNOSIS — N393 Stress incontinence (female) (male): Secondary | ICD-10-CM

## 2019-07-07 NOTE — Progress Notes (Signed)
ANNUAL PREVENTATIVE CARE GYNECOLOGY  ENCOUNTER NOTE  Subjective:       Sharon Le is a 64 y.o. G0P0000 female here for a routine annual gynecologic exam. She was previously seen by Dr. Malachi Paradise. The patient is not currently sexually active. The patient is taking hormone replacement therapy. Patient denies post-menopausal vaginal bleeding. The patient wears seatbelts: yes. Has the patient ever been transfused or tattooed?: no  Current complaints: 1.  Reports constipation and flatulence.  Unable to hold gas anymore, reports leaking.  Has occasional had stool leakage but does not happen frequently.   2. Bladder leaking since last year.  Notes that she voids regularly but will leak when exercising (walks 4-5 times). 3. Patient reports that she recently stopped taking her thyroid medication due to side effects (leg swelling). Notes that there was debate between Endocrinologist and PCP as to whether medication should be initiated as she was only subclinical hypothyroidism.    Gynecologic History No LMP recorded. Patient has had a hysterectomy. Contraception: post menopausal status and patient has had hysterectomy Last Pap: 07/17/2017. Results were: normal Last mammogram: 04/07/2019. Results were: normal. Remote history of abnormal pap smears.  Last Colonoscopy: 09/2015. Needs repeat in 5 years due to colon polyp Last Dexa Scan: patient has never had one.    Obstetric History OB History  Gravida Para Term Preterm AB Living  0 0 0 0 0 0  SAB TAB Ectopic Multiple Live Births  0 0 0 0    Obstetric Comments  1st Menstrual Cycle:  16       Past Medical History:  Diagnosis Date  . Abnormal TSH   . Adenomatous colon polyp   . Anemia   . Basal cell carcinoma   . Complication of anesthesia    HARD TIME WAKING UP AFTER HYSTERECTOMY  . Decreased fibrinogen ([place])   . Elevated TSH   . Family history of adverse reaction to anesthesia    PT UNSURE  . Fatigue   . History of  kidney stones   . Intraductal papilloma of breast, left 2019  . MVP (mitral valve prolapse)    DX AT AGE 89-PT ASYMPTOMATIC AND HAS NEVER SEEN CARDIOLOGIST OR HAD ECHO DONE  . Osteopenia   . Retinal tear   . Rosacea   . Vasomotor instability   . Yeast infection     Family History  Problem Relation Age of Onset  . Vascular Disease Father   . Colon cancer Paternal Aunt   . Heart disease Maternal Grandmother   . Breast cancer Maternal Aunt 65  . Diabetes Neg Hx   . Ovarian cancer Neg Hx     Past Surgical History:  Procedure Laterality Date  . BASAL CELL CARCINOMA EXCISION  abd  . BREAST DUCTAL SYSTEM EXCISION Left 06/07/2017   Procedure: EXCISION DUCTAL SYSTEM BREAST;  Surgeon: Robert Bellow, MD;  Location: [facility] ORS;  Service: General;  Laterality: Left;  . BREAST EXCISIONAL BIOPSY Left 06/07/2017   SCLEROSING INTRADUCTAL PAPILLOMA, 4 MM  . COLONOSCOPY WITH PROPOFOL N/A 10/05/2015   Procedure: COLONOSCOPY WITH PROPOFOL;  Surgeon: Manya Silvas, MD;  Location: [facility] ENDOSCOPY;  Service: Endoscopy;  Laterality: N/A;  . VAGINAL HYSTERECTOMY  2010   [facility]  . WISDOM TOOTH EXTRACTION      Social History   Socioeconomic History  . Marital status: Single    Spouse name: Not on file  . Number of children: Not on file  . Years of education: Not  on file  . Highest education level: Not on file  Occupational History  . Not on file  Tobacco Use  . Smoking status: Never Smoker  . Smokeless tobacco: Never Used  Substance and Sexual Activity  . Alcohol use: No  . Drug use: No  . Sexual activity: Not Currently  Other Topics Concern  . Not on file  Social History Narrative  . Not on file   Social Determinants of Health   Financial Resource Strain:   . Difficulty of Paying Living Expenses: Not on file  Food Insecurity:   . Worried About Charity fundraiser in the Last Year: Not on file  . Ran Out of Food in the Last Year: Not on file  Transportation Needs:   . Lack of  Transportation (Medical): Not on file  . Lack of Transportation (Non-Medical): Not on file  Physical Activity:   . Days of Exercise per Week: Not on file  . Minutes of Exercise per Session: Not on file  Stress:   . Feeling of Stress : Not on file  Social Connections:   . Frequency of Communication with Friends and Family: Not on file  . Frequency of Social Gatherings with Friends and Family: Not on file  . Attends Religious Services: Not on file  . Active Member of Clubs or Organizations: Not on file  . Attends Archivist Meetings: Not on file  . Marital Status: Not on file  Intimate Partner Violence:   . Fear of Current or Ex-Partner: Not on file  . Emotionally Abused: Not on file  . Physically Abused: Not on file  . Sexually Abused: Not on file    Current Outpatient Medications on File Prior to Visit  Medication Sig Dispense Refill  . aspirin-acetaminophen-caffeine (EXCEDRIN MIGRAINE) 250-250-65 MG tablet Take 1 tablet by mouth every 6 (six) hours as needed for headache.    . Azelaic Acid (FINACEA) 15 % cream Apply 1 application topically daily.     Marland Kitchen estradiol (VIVELLE-DOT) 0.025 MG/24HR Place 1 patch onto the skin 2 (two) times a week. 24 patch 3  . olopatadine (PATANOL) 0.1 % ophthalmic solution Place 1 drop into both eyes 2 (two) times daily as needed for allergies (seasonal).      No current facility-administered medications on file prior to visit.    No Known Allergies   Review of Systems ROS Review of Systems - General ROS: negative for - chills, fatigue, fever, hot flashes, night sweats, weight gain or weight loss Psychological ROS: negative for - anxiety, decreased libido, depression, mood swings, physical abuse or sexual abuse Ophthalmic ROS: negative for - blurry vision, eye pain or loss of vision ENT ROS: negative for - headaches, hearing change, visual changes or vocal changes Allergy and Immunology ROS: negative for - hives, itchy/watery eyes or  seasonal allergies Hematological and Lymphatic ROS: negative for - bleeding problems, bruising, swollen lymph nodes or weight loss Endocrine ROS: negative for - galactorrhea, hair pattern changes, hot flashes, malaise/lethargy, mood swings, palpitations, polydipsia/polyuria, skin changes,  or unexpected weight changes. Reports temperature intolerance in hands and feet. (often cold)  Breast ROS: negative for - new or changing breast lumps or nipple discharge Respiratory ROS: negative for - cough or shortness of breath Cardiovascular ROS: negative for - chest pain, irregular heartbeat, palpitations or shortness of breath Gastrointestinal ROS: no abdominal pain, change in bowel habits, or black or bloody stools. Notes gas (and occasional small stool leaks).  Genito-Urinary ROS: no dysuria, trouble  voiding, or hematuria. Positive for urinary leakage with exercising.  Musculoskeletal ROS: negative for - joint pain or joint stiffness Neurological ROS: negative for - bowel and bladder control changes Dermatological ROS: negative for rash and skin lesion changes   Objective:   BP (!) 145/72   Pulse 79   Ht 5\' 5"  (1.651 m)   Wt 126 lb 3.2 oz (57.2 kg)   BMI 21.00 kg/m  CONSTITUTIONAL: Well-developed, well-nourished female in no acute distress.  PSYCHIATRIC: Normal mood and affect. Normal behavior. Normal judgment and thought content. [place]: Alert and oriented to person, place, and time. Normal muscle tone coordination. No cranial nerve deficit noted. HENT:  Normocephalic, atraumatic, External right and left ear normal. Oropharynx is clear and moist EYES: Conjunctivae and EOM are normal. Pupils are equal, round, and reactive to light. No scleral icterus.  NECK: Normal range of motion, supple, no masses.  Normal thyroid.  SKIN: Skin is warm and dry. No rash noted. Not diaphoretic. No erythema. No pallor. CARDIOVASCULAR: Normal heart rate noted, regular rhythm, no murmur. RESPIRATORY: Clear to  auscultation bilaterally. Effort and breath sounds normal, no problems with respiration noted. BREASTS: Symmetric in size. No masses, skin changes, nipple drainage, or lymphadenopathy. ABDOMEN: Soft, normal bowel sounds, no distention noted.  No tenderness, rebound or guarding.  BLADDER: Normal PELVIC:  Bladder no bladder distension noted  Urethra: normal appearing urethra with no masses, tenderness or lesions  Vulva: normal appearing vulva with no masses, tenderness or lesions  Vagina: atrophic, with no lesions or discharge. Pelvic floor mildly weak. Grade 1 cystocele present.   Cervix: normal appearing cervix without discharge or lesions  Uterus: surgically absent  Adnexa: normal adnexa in size, nontender and no masses  RV: External Exam NormaI, No Rectal Masses and Mildly weakened sphincter tone  MUSCULOSKELETAL: Normal range of motion. No tenderness.  No cyanosis, clubbing, or edema.  2+ distal pulses. LYMPHATIC: No Axillary, Supraclavicular, or Inguinal Adenopathy.   Labs: Lab Results  Component Value Date   WBC 4.4 11/04/2018   HGB 12.8 11/04/2018   HCT 37.8 11/04/2018   MCV 96 11/04/2018   PLT 211 11/04/2018    Lab Results  Component Value Date   CREATININE 0.86 11/04/2018   BUN 14 11/04/2018   NA 139 11/04/2018   K 4.4 11/04/2018   CL 101 11/04/2018   CO2 22 11/04/2018    Lab Results  Component Value Date   ALT 10 11/04/2018   AST 15 11/04/2018   ALKPHOS 63 11/04/2018   BILITOT 0.5 11/04/2018    Lab Results  Component Value Date   CHOL 160 11/04/2018   HDL 65 11/04/2018   LDLCALC 86 11/04/2018   TRIG 43 11/04/2018   CHOLHDL 2.5 11/04/2018    Lab Results  Component Value Date   TSH 0.401 (L) 01/13/2019    Lab Results  Component Value Date   HGBA1C 5.8 (H) 07/08/2017     Assessment:   1. Encounter for well woman exam with routine gynecological exam   2. Breast cancer screening by mammogram   3. Menopausal vasomotor syndrome   4. Subclinical  hypothyroidism   5. Chronic idiopathic constipation   6. Stress incontinence of urine   7. Flatulence symptom     Plan:  - Pap: Pap Co Test performed.  - Mammogram: Ordered - Stool Guaiac Testing:  Not Ordered, up to date.  - Labs: Lipid 1, TSH, Hemoglobin A1C, CBC, CMP. Notes she would like for GYN to be her PCP  as she otherwise has no major medical issues.  - Routine preventative health maintenance measures emphasized: Exercise/Diet/Weight control, Tobacco Warnings, Alcohol/Substance use risks, Stress Management, Peer Pressure Issues and Safe Sex - Menopausal vasomotor symptoms, currently on HRT.  Has been on medication ~ 6 or 7 years.  Has previously been counseled on risks of continued use and encouragement on weaning. Patient notes she does not feel functional without it, would at least like to continue until she retires from working.  - Subclinical hypothyroidism, recently stopped meds. Notes her leg swelling has resolved. Will continue to "just deal with" her cold hands/feet.  - Chronic constipation, discussed increasing fluids, fiber intake, and other OTC supplements that can help. May also be the cause of her gas leakage.  - Stress incontinence present, mild cystocele present, weakened pelvic floor. Encouraged performing Kegel exercises. May also benefit from pelvic floor physical therapy and/or pessary usage (exam not significant enough to consider surgical intervention). To f/u in 2 months to reassess symptoms after Kegels.  - Return to [place] for annual exam.    Rubie Maid, MD [facility]

## 2019-07-08 ENCOUNTER — Encounter: Payer: Self-pay | Admitting: Obstetrics and Gynecology

## 2019-08-11 ENCOUNTER — Telehealth: Payer: Self-pay | Admitting: Obstetrics and Gynecology

## 2019-08-11 NOTE — Telephone Encounter (Signed)
Called pt the pt needs annual labs and the pt wants the sent to her wellness center in [place], the Fax number is [phone]. The pt stated that they can be faxed and dr. Marcelline Mates can still see them. Please advise

## 2019-08-13 NOTE — Telephone Encounter (Signed)
Pt informed that her labs have been faxed to the wellness center in [place].

## 2019-08-17 NOTE — Progress Notes (Signed)
Pt is present for f/u for pelvic floor issues. Pt stated that she may have noticed some changes but is unsure.

## 2019-08-18 ENCOUNTER — Encounter: Payer: Self-pay | Admitting: Obstetrics and Gynecology

## 2019-08-18 ENCOUNTER — Other Ambulatory Visit: Payer: Self-pay

## 2019-08-18 ENCOUNTER — Ambulatory Visit: Payer: BC Managed Care – PPO | Admitting: Obstetrics and Gynecology

## 2019-08-18 VITALS — BP 130/68 | HR 74 | Ht 65.0 in | Wt 122.9 lb

## 2019-08-18 DIAGNOSIS — K5904 Chronic idiopathic constipation: Secondary | ICD-10-CM | POA: Diagnosis not present

## 2019-08-18 DIAGNOSIS — R143 Flatulence: Secondary | ICD-10-CM

## 2019-08-18 DIAGNOSIS — N8189 Other female genital prolapse: Secondary | ICD-10-CM | POA: Diagnosis not present

## 2019-08-18 DIAGNOSIS — N393 Stress incontinence (female) (male): Secondary | ICD-10-CM

## 2019-08-18 NOTE — Progress Notes (Signed)
    GYNECOLOGY PROGRESS NOTE  Subjective:    Patient ID: Sharon Le, female    DOB: 09-03-1955, 64 y.o.   MRN: [id number]  HPI  Patient is a 64 y.o. G0P0000 female who presents for 6 week follow up of stress incontinence and weakened pelvic floor.  She was advised on Kegel exercises last visit. Patient reports that she has not been very compliant with exercises as she has been under a lot of pressure and stress at work for the past few months. She has not had much time to focus on herself.   She does however report that her constipation is improving, is eating an apple very day, increasing her fluids, and increasing fiber intake. Still experiencing some involuntary flatulence leakage.   The following portions of the patient's history were reviewed and updated as appropriate: allergies, current medications, past family history, past medical history, past social history, past surgical history and problem list.  Review of Systems Pertinent items noted in HPI and remainder of comprehensive ROS otherwise negative.   Objective:   Blood pressure 130/68, pulse 74, height 5\' 5"  (1.651 m), weight 122 lb 14.4 oz (55.7 kg). General appearance: alert and no distress Remainder of exam deferred.    Assessment:   1. Flatulence symptom   2. Chronic idiopathic constipation   3. Stress incontinence of urine   4. Pelvic floor weakness    Plan:   1. Discussed avoidance of foods that could increase bloating and flatulence. Given list of foods.  2. Chronic constipation is improving with dietary modifications. Encouraged to continue.  3. Stress incontinence and pelvic floor weakness - patient reports that by May her job functions will return to normal and she will have more time to devote to improving her pelvic floor and be better compliant in the near future.  Offered referral to pelvic floor physical therapy in order to possibly schedule structured time for exercises, but patient notes it would not  work with her current schedule. Will return as needed, or if further interventions are warranted.    Rubie Maid, MD [facility]

## 2019-09-02 ENCOUNTER — Other Ambulatory Visit: Payer: BC Managed Care – PPO

## 2019-09-02 ENCOUNTER — Other Ambulatory Visit: Payer: Self-pay

## 2019-09-02 DIAGNOSIS — Z01419 Encounter for gynecological examination (general) (routine) without abnormal findings: Secondary | ICD-10-CM

## 2019-09-02 DIAGNOSIS — L118 Other specified acantholytic disorders: Secondary | ICD-10-CM | POA: Diagnosis not present

## 2019-09-02 DIAGNOSIS — E039 Hypothyroidism, unspecified: Secondary | ICD-10-CM

## 2019-09-02 DIAGNOSIS — L309 Dermatitis, unspecified: Secondary | ICD-10-CM | POA: Diagnosis not present

## 2019-09-03 LAB — CBC WITH DIFFERENTIAL/PLATELET
Basophils Absolute: 0 10*3/uL (ref 0.0–0.2)
Basos: 1 %
EOS (ABSOLUTE): 0.1 10*3/uL (ref 0.0–0.4)
Eos: 2 %
Hematocrit: 36.6 % (ref 34.0–46.6)
Hemoglobin: 12.3 g/dL (ref 11.1–15.9)
Immature Grans (Abs): 0 10*3/uL (ref 0.0–0.1)
Immature Granulocytes: 0 %
Lymphocytes Absolute: 1.5 10*3/uL (ref 0.7–3.1)
Lymphs: 28 %
MCH: 31.6 pg (ref 26.6–33.0)
MCHC: 33.6 g/dL (ref 31.5–35.7)
MCV: 94 fL (ref 79–97)
Monocytes Absolute: 0.5 10*3/uL (ref 0.1–0.9)
Monocytes: 9 %
Neutrophils Absolute: 3.1 10*3/uL (ref 1.4–7.0)
Neutrophils: 60 %
Platelets: 237 10*3/uL (ref 150–450)
RBC: 3.89 x10E6/uL (ref 3.77–5.28)
RDW: 11.9 % (ref 11.7–15.4)
WBC: 5.2 10*3/uL (ref 3.4–10.8)

## 2019-09-03 LAB — LIPID PANEL
Chol/HDL Ratio: 2.7 ratio (ref 0.0–4.4)
Cholesterol, Total: 181 mg/dL (ref 100–199)
HDL: 66 mg/dL (ref >39)
LDL Chol Calc (NIH): 104 mg/dL — ABNORMAL HIGH (ref 0–99)
Triglycerides: 59 mg/dL (ref 0–149)
VLDL Cholesterol Cal: 11 mg/dL (ref 5–40)

## 2019-09-03 LAB — COMPREHENSIVE METABOLIC PANEL
ALT: 10 IU/L (ref 0–32)
AST: 20 IU/L (ref 0–40)
Albumin/Globulin Ratio: 2.6 — ABNORMAL HIGH (ref 1.2–2.2)
Albumin: 4.5 g/dL (ref 3.8–4.8)
Alkaline Phosphatase: 72 IU/L (ref 39–117)
BUN/Creatinine Ratio: 12 (ref 12–28)
BUN: 10 mg/dL (ref 8–27)
Bilirubin Total: 0.7 mg/dL (ref 0.0–1.2)
CO2: 24 mmol/L (ref 20–29)
Calcium: 9.2 mg/dL (ref 8.7–10.3)
Chloride: 101 mmol/L (ref 96–106)
Creatinine, Ser: 0.82 mg/dL (ref 0.57–1.00)
GFR calc Af Amer: 89 mL/min/{1.73_m2} (ref >59)
GFR calc non Af Amer: 77 mL/min/{1.73_m2} (ref >59)
Globulin, Total: 1.7 g/dL (ref 1.5–4.5)
Glucose: 90 mg/dL (ref 65–99)
Potassium: 4.9 mmol/L (ref 3.5–5.2)
Sodium: 137 mmol/L (ref 134–144)
Total Protein: 6.2 g/dL (ref 6.0–8.5)

## 2019-09-03 LAB — TSH: TSH: 4.65 u[IU]/mL — ABNORMAL HIGH (ref 0.450–4.500)

## 2019-09-03 LAB — HGB A1C W/O EAG: Hgb A1c MFr Bld: 5.6 % (ref 4.8–5.6)

## 2020-02-26 ENCOUNTER — Other Ambulatory Visit: Payer: Self-pay | Admitting: Obstetrics and Gynecology

## 2020-02-29 NOTE — Telephone Encounter (Signed)
Patient called in and wanted to give Dr. Marcelline Mates a heads up that [facility] be in contact with her. The Patient says that she is on her last month of patches. I told the patient that I see the unsigned order and that I will send a message to the nurse. The patient also said that she tried to make an appointment with urology, she says she wanted to have her  kidney stones checked as well as she is experiencing a Leaking  of urine. I told the pt to please allow 24-72 hours for a reply and that the nurse may reach out to you thourgh my chart or she may call you. The patient verbally understood. Please advise

## 2020-03-07 ENCOUNTER — Ambulatory Visit: Payer: Self-pay | Attending: Internal Medicine

## 2020-03-07 DIAGNOSIS — Z23 Encounter for immunization: Secondary | ICD-10-CM

## 2020-03-07 NOTE — Progress Notes (Signed)
   Covid-19 Vaccination Clinic  Name:  Sharon Le    MRN: [id number] DOB: 1956-02-26  03/07/2020  Sharon Le was observed post Covid-19 immunization for 15 minutes without incident. She was provided with Vaccine Information Sheet and instruction to access the V-Safe system.   Sharon Le was instructed to call 911 with any severe reactions post vaccine: Marland Kitchen Difficulty breathing  . Swelling of face and throat  . A fast heartbeat  . A bad rash all over body  . Dizziness and weakness

## 2020-04-28 ENCOUNTER — Ambulatory Visit
Admission: RE | Admit: 2020-04-28 | Discharge: 2020-04-28 | Disposition: A | Discharge status: Home / Self Care | Payer: BC Managed Care – PPO | Source: Ambulatory Visit | Attending: Obstetrics and Gynecology | Admitting: Obstetrics and Gynecology

## 2020-04-28 ENCOUNTER — Other Ambulatory Visit: Payer: Self-pay

## 2020-04-28 DIAGNOSIS — Z01419 Encounter for gynecological examination (general) (routine) without abnormal findings: Secondary | ICD-10-CM | POA: Diagnosis not present

## 2020-04-28 DIAGNOSIS — Z1231 Encounter for screening mammogram for malignant neoplasm of breast: Secondary | ICD-10-CM | POA: Diagnosis not present

## 2020-05-26 DIAGNOSIS — D2371 Other benign neoplasm of skin of right lower limb, including hip: Secondary | ICD-10-CM | POA: Diagnosis not present

## 2020-05-26 DIAGNOSIS — L821 Other seborrheic keratosis: Secondary | ICD-10-CM | POA: Diagnosis not present

## 2020-05-26 DIAGNOSIS — L718 Other rosacea: Secondary | ICD-10-CM | POA: Diagnosis not present

## 2020-05-26 DIAGNOSIS — Z85828 Personal history of other malignant neoplasm of skin: Secondary | ICD-10-CM | POA: Diagnosis not present

## 2020-06-15 ENCOUNTER — Telehealth: Payer: Self-pay

## 2020-06-15 DIAGNOSIS — N951 Menopausal and female climacteric states: Secondary | ICD-10-CM

## 2020-06-15 DIAGNOSIS — E038 Other specified hypothyroidism: Secondary | ICD-10-CM

## 2020-06-15 NOTE — Telephone Encounter (Signed)
Pt called in and stated that she would like to have her labs done before her physical with Dr. Marcelline Mates. The pt stated that they can be sent to the wellness center at [facility] fax number [phone]. Please advise

## 2020-06-15 NOTE — Telephone Encounter (Signed)
Spoke to pt to see what labs she would like to have completed. Pt voiced that she would like to have the same labs she had last year. Orders placed and faxed to number provided by pt.

## 2020-06-21 ENCOUNTER — Other Ambulatory Visit: Payer: Self-pay

## 2020-06-21 ENCOUNTER — Other Ambulatory Visit: Payer: BC Managed Care – PPO

## 2020-06-21 DIAGNOSIS — E065 Other chronic thyroiditis: Secondary | ICD-10-CM

## 2020-06-21 DIAGNOSIS — N951 Menopausal and female climacteric states: Secondary | ICD-10-CM

## 2020-06-21 DIAGNOSIS — E038 Other specified hypothyroidism: Secondary | ICD-10-CM

## 2020-06-22 LAB — CBC WITH DIFFERENTIAL/PLATELET
Basophils Absolute: 0 10*3/uL (ref 0.0–0.2)
Basos: 1 %
EOS (ABSOLUTE): 0.1 10*3/uL (ref 0.0–0.4)
Eos: 2 %
Hematocrit: 37.8 % (ref 34.0–46.6)
Hemoglobin: 12.6 g/dL (ref 11.1–15.9)
Immature Grans (Abs): 0 10*3/uL (ref 0.0–0.1)
Immature Granulocytes: 0 %
Lymphocytes Absolute: 1.3 10*3/uL (ref 0.7–3.1)
Lymphs: 32 %
MCH: 31.4 pg (ref 26.6–33.0)
MCHC: 33.3 g/dL (ref 31.5–35.7)
MCV: 94 fL (ref 79–97)
Monocytes Absolute: 0.5 10*3/uL (ref 0.1–0.9)
Monocytes: 13 %
Neutrophils Absolute: 2.2 10*3/uL (ref 1.4–7.0)
Neutrophils: 52 %
Platelets: 208 10*3/uL (ref 150–450)
RBC: 4.01 x10E6/uL (ref 3.77–5.28)
RDW: 12 % (ref 11.7–15.4)
WBC: 4.2 10*3/uL (ref 3.4–10.8)

## 2020-06-22 LAB — COMPREHENSIVE METABOLIC PANEL
ALT: 9 IU/L (ref 0–32)
AST: 16 IU/L (ref 0–40)
Albumin/Globulin Ratio: 2.3 — ABNORMAL HIGH (ref 1.2–2.2)
Albumin: 4.3 g/dL (ref 3.8–4.8)
Alkaline Phosphatase: 67 IU/L (ref 44–121)
BUN/Creatinine Ratio: 14 (ref 12–28)
BUN: 12 mg/dL (ref 8–27)
Bilirubin Total: 0.7 mg/dL (ref 0.0–1.2)
CO2: 27 mmol/L (ref 20–29)
Calcium: 9 mg/dL (ref 8.7–10.3)
Chloride: 100 mmol/L (ref 96–106)
Creatinine, Ser: 0.85 mg/dL (ref 0.57–1.00)
GFR calc Af Amer: 84 mL/min/{1.73_m2} (ref >59)
GFR calc non Af Amer: 73 mL/min/{1.73_m2} (ref >59)
Globulin, Total: 1.9 g/dL (ref 1.5–4.5)
Glucose: 101 mg/dL — ABNORMAL HIGH (ref 65–99)
Potassium: 4.6 mmol/L (ref 3.5–5.2)
Sodium: 138 mmol/L (ref 134–144)
Total Protein: 6.2 g/dL (ref 6.0–8.5)

## 2020-06-22 LAB — LIPID PANEL
Chol/HDL Ratio: 2.9 ratio (ref 0.0–4.4)
Cholesterol, Total: 194 mg/dL (ref 100–199)
HDL: 67 mg/dL (ref >39)
LDL Chol Calc (NIH): 118 mg/dL — ABNORMAL HIGH (ref 0–99)
Triglycerides: 48 mg/dL (ref 0–149)
VLDL Cholesterol Cal: 9 mg/dL (ref 5–40)

## 2020-06-22 LAB — THYROID PANEL WITH TSH
Free Thyroxine Index: 1.9 (ref 1.2–4.9)
T3 Uptake Ratio: 28 % (ref 24–39)
T4, Total: 6.8 ug/dL (ref 4.5–12.0)
TSH: 4.9 u[IU]/mL — ABNORMAL HIGH (ref 0.450–4.500)

## 2020-06-22 LAB — HGB A1C W/O EAG: Hgb A1c MFr Bld: 5.8 % — ABNORMAL HIGH (ref 4.8–5.6)

## 2020-07-06 NOTE — Patient Instructions (Signed)
Preventive Care 84-64 Years Old, Female Preventive care refers to lifestyle choices and visits with your health care provider that can promote health and wellness. This includes:  A yearly physical exam. This is also called an annual wellness visit.  Regular dental and eye exams.  Immunizations.  Screening for certain conditions.  Healthy lifestyle choices, such as: ? Eating a healthy diet. ? Getting regular exercise. ? Not using drugs or products that contain nicotine and tobacco. ? Limiting alcohol use. What can I expect for my preventive care visit? Physical exam Your health care provider will check your:  Height and weight. These may be used to calculate your BMI (body mass index). BMI is a measurement that tells if you are at a healthy weight.  Heart rate and blood pressure.  Body temperature.  Skin for abnormal spots. Counseling Your health care provider may ask you questions about your:  Past medical problems.  Family's medical history.  Alcohol, tobacco, and drug use.  Emotional well-being.  Home life and relationship well-being.  Sexual activity.  Diet, exercise, and sleep habits.  Work and work Statistician.  Access to firearms.  Method of birth control.  Menstrual cycle.  Pregnancy history. What immunizations do I need? Vaccines are usually given at various ages, according to a schedule. Your health care provider will recommend vaccines for you based on your age, medical history, and lifestyle or other factors, such as travel or where you work.   What tests do I need? Blood tests  Lipid and cholesterol levels. These may be checked every 5 years, or more often if you are over 3 years old.  Hepatitis C test.  Hepatitis B test. Screening  Lung cancer screening. You may have this screening every year starting at age 73 if you have a 30-pack-year history of smoking and currently smoke or have quit within the past 15 years.  Colorectal cancer  screening. ? All adults should have this screening starting at age 52 and continuing until age 17. ? Your health care provider may recommend screening at age 49 if you are at increased risk. ? You will have tests every 1-10 years, depending on your results and the type of screening test.  Diabetes screening. ? This is done by checking your blood sugar (glucose) after you have not eaten for a while (fasting). ? You may have this done every 1-3 years.  Mammogram. ? This may be done every 1-2 years. ? Talk with your health care provider about when you should start having regular mammograms. This may depend on whether you have a family history of breast cancer.  BRCA-related cancer screening. This may be done if you have a family history of breast, ovarian, tubal, or peritoneal cancers.  Pelvic exam and Pap test. ? This may be done every 3 years starting at age 10. ? Starting at age 11, this may be done every 5 years if you have a Pap test in combination with an HPV test. Other tests  STD (sexually transmitted disease) testing, if you are at risk.  Bone density scan. This is done to screen for osteoporosis. You may have this scan if you are at high risk for osteoporosis. Talk with your health care provider about your test results, treatment options, and if necessary, the need for more tests. Follow these instructions at home: Eating and drinking  Eat a diet that includes fresh fruits and vegetables, whole grains, lean protein, and low-fat dairy products.  Take vitamin and mineral supplements  as recommended by your health care provider.  Do not drink alcohol if: ? Your health care provider tells you not to drink. ? You are pregnant, may be pregnant, or are planning to become pregnant.  If you drink alcohol: ? Limit how much you have to 0-1 drink a day. ? Be aware of how much alcohol is in your drink. In the [place] one drink equals one 12 oz bottle of beer (355 mL), one 5 oz glass of  wine (148 mL), or one 1 oz glass of hard liquor (44 mL).   Lifestyle  Take daily care of your teeth and gums. Brush your teeth every morning and night with fluoride toothpaste. Floss one time each day.  Stay active. Exercise for at least 30 minutes 5 or more days each week.  Do not use any products that contain nicotine or tobacco, such as cigarettes, e-cigarettes, and chewing tobacco. If you need help quitting, ask your health care provider.  Do not use drugs.  If you are sexually active, practice safe sex. Use a condom or other form of protection to prevent STIs (sexually transmitted infections).  If you do not wish to become pregnant, use a form of birth control. If you plan to become pregnant, see your health care provider for a prepregnancy visit.  If told by your health care provider, take low-dose aspirin daily starting at age 75.  Find healthy ways to cope with stress, such as: ? Meditation, yoga, or listening to music. ? Journaling. ? Talking to a trusted person. ? Spending time with friends and family. Safety  Always wear your seat belt while driving or riding in a vehicle.  Do not drive: ? If you have been drinking alcohol. Do not ride with someone who has been drinking. ? When you are tired or distracted. ? While texting.  Wear a helmet and other protective equipment during sports activities.  If you have firearms in your house, make sure you follow all gun safety procedures. What's next?  Visit your health care provider once a year for an annual wellness visit.  Ask your health care provider how often you should have your eyes and teeth checked.  Stay up to date on all vaccines. This information is not intended to replace advice given to you by your health care provider. Make sure you discuss any questions you have with your health care provider. Document Revised: 02/09/2020 Document Reviewed: 01/16/2018 Elsevier Patient Education  2021 [place] Breast self-awareness is knowing how your breasts look and feel. Doing breast self-awareness is important. It allows you to catch a breast problem early while it is still small and can be treated. All women should do breast self-awareness, including women who have had breast implants. Tell your doctor if you notice a change in your breasts. What you need:  A mirror.  A well-lit room. How to do a breast self-exam A breast self-exam is one way to learn what is normal for your breasts and to check for changes. To do a breast self-exam: Look for changes 1. Take off all the clothes above your waist. 2. Stand in front of a mirror in a room with good lighting. 3. Put your hands on your hips. 4. Push your hands down. 5. Look at your breasts and nipples in the mirror to see if one breast or nipple looks different from the other. Check to see if: ? The shape of one breast is different. ? The size of  one breast is different. ? There are wrinkles, dips, and bumps in one breast and not the other. 6. Look at each breast for changes in the skin, such as: ? Redness. ? Scaly areas. 7. Look for changes in your nipples, such as: ? Liquid around the nipples. ? Bleeding. ? Dimpling. ? Redness. ? A change in where the nipples are.   Feel for changes 1. Lie on your back on the floor. 2. Feel each breast. To do this, follow these steps: ? Pick a breast to feel. ? Put the arm closest to that breast above your head. ? Use your other arm to feel the nipple area of your breast. Feel the area with the pads of your three middle fingers by making small circles with your fingers. For the first circle, press lightly. For the second circle, press harder. For the third circle, press even harder. ? Keep making circles with your fingers at the different pressures as you move down your breast. Stop when you feel your ribs. ? Move your fingers a little toward the center of your body. ? Start making  circles with your fingers again, this time going up until you reach your collarbone. ? Keep making up-and-down circles until you reach your armpit. Remember to keep using the three pressures. ? Feel the other breast in the same way. 3. Sit or stand in the tub or shower. 4. With soapy water on your skin, feel each breast the same way you did in step 2 when you were lying on the floor.   Write down what you find Writing down what you find can help you remember what to tell your doctor. Write down:  What is normal for each breast.  Any changes you find in each breast, including: ? The kind of changes you find. ? Whether you have pain. ? Size and location of any lumps.  When you last had your menstrual period. General tips  Check your breasts every month.  If you are breastfeeding, the best time to check your breasts is after you feed your baby or after you use a breast pump.  If you get menstrual periods, the best time to check your breasts is 5-7 days after your menstrual period is over.  With time, you will become comfortable with the self-exam, and you will begin to know if there are changes in your breasts. Contact a doctor if you:  See a change in the shape or size of your breasts or nipples.  See a change in the skin of your breast or nipples, such as red or scaly skin.  Have fluid coming from your nipples that is not normal.  Find a lump or thick area that was not there before.  Have pain in your breasts.  Have any concerns about your breast health. Summary  Breast self-awareness includes looking for changes in your breasts, as well as feeling for changes within your breasts.  Breast self-awareness should be done in front of a mirror in a well-lit room.  You should check your breasts every month. If you get menstrual periods, the best time to check your breasts is 5-7 days after your menstrual period is over.  Let your doctor know of any changes you see in your  breasts, including changes in size, changes on the skin, pain or tenderness, or fluid from your nipples that is not normal. This information is not intended to replace advice given to you by your health care provider. Make sure  you discuss any questions you have with your health care provider. Document Revised: 12/24/2017 Document Reviewed: 12/24/2017 Elsevier Patient Education  [place].

## 2020-07-06 NOTE — Progress Notes (Signed)
Pt present for annual exam. Pt stated that she has noticed some urine leaking.

## 2020-07-07 ENCOUNTER — Other Ambulatory Visit (HOSPITAL_COMMUNITY)
Admission: RE | Admit: 2020-07-07 | Discharge: 2020-07-07 | Disposition: A | Discharge status: Home / Self Care | Payer: BC Managed Care – PPO | Source: Ambulatory Visit | Attending: Obstetrics and Gynecology | Admitting: Obstetrics and Gynecology

## 2020-07-07 ENCOUNTER — Other Ambulatory Visit: Payer: Self-pay

## 2020-07-07 ENCOUNTER — Ambulatory Visit (INDEPENDENT_AMBULATORY_CARE_PROVIDER_SITE_OTHER): Payer: BC Managed Care – PPO | Admitting: Obstetrics and Gynecology

## 2020-07-07 ENCOUNTER — Encounter: Payer: Self-pay | Admitting: Obstetrics and Gynecology

## 2020-07-07 VITALS — BP 123/75 | HR 67 | Ht 65.0 in | Wt 124.4 lb

## 2020-07-07 DIAGNOSIS — Z124 Encounter for screening for malignant neoplasm of cervix: Secondary | ICD-10-CM | POA: Diagnosis not present

## 2020-07-07 DIAGNOSIS — Z1231 Encounter for screening mammogram for malignant neoplasm of breast: Secondary | ICD-10-CM

## 2020-07-07 DIAGNOSIS — R7303 Prediabetes: Secondary | ICD-10-CM

## 2020-07-07 DIAGNOSIS — Z01419 Encounter for gynecological examination (general) (routine) without abnormal findings: Secondary | ICD-10-CM

## 2020-07-07 DIAGNOSIS — E038 Other specified hypothyroidism: Secondary | ICD-10-CM

## 2020-07-07 DIAGNOSIS — N393 Stress incontinence (female) (male): Secondary | ICD-10-CM

## 2020-07-07 DIAGNOSIS — E785 Hyperlipidemia, unspecified: Secondary | ICD-10-CM

## 2020-07-07 NOTE — Progress Notes (Signed)
ANNUAL PREVENTATIVE CARE GYNECOLOGY  ENCOUNTER NOTE  Subjective:       Sharon Le is a 65 y.o. G0P0000 female here for a routine annual gynecologic exam. The patient is not currently sexually active. The patient is taking hormone replacement therapy. Patient denies post-menopausal vaginal bleeding.  Has the patient ever been transfused or tattooed?: no  Current complaints: 1.  Still noting some bladder leaking. Has been ongoing for ~ 2 years.  Notes that she voids regularly but will leak when doing activities or lifting.  2. Bloating and flatulence have become better controlled with increasing water intake.     Gynecologic History No LMP recorded. Patient has had a hysterectomy. Contraception: post menopausal status and patient has had hysterectomy Last Pap: 07/17/2017. Results were: normal Last mammogram: 04/28/2020. Results were: normal. Remote history of abnormal pap smears.  Last Colonoscopy: 09/2015. Needs repeat in 5 years due to colon polyp Last Dexa Scan: patient has never had one.                                          Obstetric History OB History  Gravida Para Term Preterm AB Living  0 0 0 0 0 0  SAB IAB Ectopic Multiple Live Births  0 0 0 0    Obstetric Comments  1st Menstrual Cycle:  16       Past Medical History:  Diagnosis Date  . Abnormal TSH   . Adenomatous colon polyp   . Anemia   . Basal cell carcinoma   . Complication of anesthesia    HARD TIME WAKING UP AFTER HYSTERECTOMY  . Decreased fibrinogen ([place])   . Elevated TSH   . Family history of adverse reaction to anesthesia    PT UNSURE  . Fatigue   . History of kidney stones   . Intraductal papilloma of breast, left 2019  . MVP (mitral valve prolapse)    DX AT AGE 38-PT ASYMPTOMATIC AND HAS NEVER SEEN CARDIOLOGIST OR HAD ECHO DONE  . Osteopenia   . Retinal tear   . Rosacea   . Vasomotor instability   . Yeast infection     Family History  Problem Relation Age of Onset  . Vascular Disease  Father   . Colon cancer Paternal Aunt   . Heart disease Maternal Grandmother   . Breast cancer Maternal Aunt 65  . Diabetes Neg Hx   . Ovarian cancer Neg Hx     Past Surgical History:  Procedure Laterality Date  . BASAL CELL CARCINOMA EXCISION  abd  . BREAST DUCTAL SYSTEM EXCISION Left 06/07/2017   Procedure: EXCISION DUCTAL SYSTEM BREAST;  Surgeon: Robert Bellow, MD;  Location: [facility] ORS;  Service: General;  Laterality: Left;  . BREAST EXCISIONAL BIOPSY Left 06/07/2017   SCLEROSING INTRADUCTAL PAPILLOMA, 4 MM  . COLONOSCOPY WITH PROPOFOL N/A 10/05/2015   Procedure: COLONOSCOPY WITH PROPOFOL;  Surgeon: Manya Silvas, MD;  Location: [facility] ENDOSCOPY;  Service: Endoscopy;  Laterality: N/A;  . VAGINAL HYSTERECTOMY  2010   [facility]  . WISDOM TOOTH EXTRACTION      Social History   Socioeconomic History  . Marital status: Single    Spouse name: Not on file  . Number of children: Not on file  . Years of education: Not on file  . Highest education level: Not on file  Occupational History  . Not on file  Tobacco Use  . Smoking status: Never Smoker  . Smokeless tobacco: Never Used  Vaping Use  . Vaping Use: Never used  Substance and Sexual Activity  . Alcohol use: No  . Drug use: No  . Sexual activity: Not Currently  Other Topics Concern  . Not on file  Social History Narrative  . Not on file   Social Determinants of Health   Financial Resource Strain: Not on file  Food Insecurity: Not on file  Transportation Needs: Not on file  Physical Activity: Not on file  Stress: Not on file  Social Connections: Not on file  Intimate Partner Violence: Not on file    Current Outpatient Medications on File Prior to Visit  Medication Sig Dispense Refill  . aspirin-acetaminophen-caffeine (EXCEDRIN MIGRAINE) 250-250-65 MG tablet Take 1 tablet by mouth every 6 (six) hours as needed for headache.    . Azelaic Acid 15 % cream Apply 1 application topically daily.     Marland Kitchen estradiol  (VIVELLE-DOT) 0.025 MG/24HR APPLY 1 PATCH ONTO THE SKIN 2 TIMES A WEEK 24 patch 3  . olopatadine (PATANOL) 0.1 % ophthalmic solution Place 1 drop into both eyes 2 (two) times daily as needed for allergies (seasonal).      No current facility-administered medications on file prior to visit.    No Known Allergies   Review of Systems ROS Review of Systems - General ROS: negative for - chills, fatigue, fever, hot flashes, night sweats, weight gain or weight loss Psychological ROS: negative for - anxiety, decreased libido, depression, mood swings, physical abuse or sexual abuse Ophthalmic ROS: negative for - blurry vision, eye pain or loss of vision ENT ROS: negative for - headaches, hearing change, visual changes or vocal changes Allergy and Immunology ROS: negative for - hives, itchy/watery eyes or seasonal allergies Hematological and Lymphatic ROS: negative for - bleeding problems, bruising, swollen lymph nodes or weight loss Endocrine ROS: negative for - galactorrhea, hair pattern changes, hot flashes, malaise/lethargy, mood swings, palpitations, polydipsia/polyuria, skin changes,  or unexpected weight changes. Breast ROS: negative for - new or changing breast lumps or nipple discharge Respiratory ROS: negative for - cough or shortness of breath Cardiovascular ROS: negative for - chest pain, irregular heartbeat, palpitations or shortness of breath Gastrointestinal ROS: no abdominal pain, change in bowel habits, or black or bloody stools.   Genito-Urinary ROS: no dysuria, trouble voiding, or hematuria. Positive for urinary leakage with exercising, coughing.  Musculoskeletal ROS: negative for - joint pain or joint stiffness Neurological ROS: negative for - bowel and bladder control changes Dermatological ROS: negative for rash and skin lesion changes   Objective:   BP 123/75 (BP Location: Left Arm, Patient Position: Sitting)   Pulse 67   Ht 5\' 5"  (1.651 m)   Wt 124 lb 7 oz (56.4 kg)    BMI 20.71 kg/m  CONSTITUTIONAL: Well-developed, well-nourished female in no acute distress.  PSYCHIATRIC: Normal mood and affect. Normal behavior. Normal judgment and thought content. [place]: Alert and oriented to person, place, and time. Normal muscle tone coordination. No cranial nerve deficit noted. HENT:  Normocephalic, atraumatic, External right and left ear normal. Oropharynx is clear and moist EYES: Conjunctivae and EOM are normal. Pupils are equal, round, and reactive to light. No scleral icterus.  NECK: Normal range of motion, supple, no masses.  Normal thyroid.  SKIN: Skin is warm and dry. No rash noted. Not diaphoretic. No erythema. No pallor. CARDIOVASCULAR: Normal heart rate noted, regular rhythm, no murmur. RESPIRATORY: Clear to  auscultation bilaterally. Effort and breath sounds normal, no problems with respiration noted. BREASTS: Symmetric in size. No masses, skin changes, nipple drainage, or lymphadenopathy. ABDOMEN: Soft, normal bowel sounds, no distention noted.  No tenderness, rebound or guarding.  BLADDER: Normal PELVIC:  Bladder no bladder distension noted  Urethra: normal appearing urethra with no masses, tenderness or lesions  Vulva: normal appearing vulva with no masses, tenderness or lesions  Vagina: atrophic, with no lesions or discharge. Pelvic floor mildly weak. Grade 1 cystocele present.   Cervix: normal appearing cervix without discharge or lesions  Uterus: surgically absent  Adnexa: normal adnexa in size, nontender and no masses  RV: External Exam NormaI, No Rectal Masses and Mildly weakened sphincter tone  MUSCULOSKELETAL: Normal range of motion. No tenderness.  No cyanosis, clubbing, or edema.  2+ distal pulses. LYMPHATIC: No Axillary, Supraclavicular, or Inguinal Adenopathy.   Labs: Lab Results  Component Value Date   WBC 4.2 06/21/2020   HGB 12.6 06/21/2020   HCT 37.8 06/21/2020   MCV 94 06/21/2020   PLT 208 06/21/2020    Lab Results   Component Value Date   CREATININE 0.85 06/21/2020   BUN 12 06/21/2020   NA 138 06/21/2020   K 4.6 06/21/2020   CL 100 06/21/2020   CO2 27 06/21/2020    Lab Results  Component Value Date   ALT 9 06/21/2020   AST 16 06/21/2020   ALKPHOS 67 06/21/2020   BILITOT 0.7 06/21/2020    Lab Results  Component Value Date   CHOL 194 06/21/2020   HDL 67 06/21/2020   LDLCALC 118 (H) 06/21/2020   TRIG 48 06/21/2020   CHOLHDL 2.9 06/21/2020    Lab Results  Component Value Date   TSH 4.900 (H) 06/21/2020    Lab Results  Component Value Date   HGBA1C 5.8 (H) 06/21/2020     Assessment:   1. Encounter for well woman exam with routine gynecological exam   2. Breast cancer screening by mammogram   3. Pap smear for cervical cancer screening   4. Subclinical hypothyroidism   5. Stress incontinence of urine   6. Prediabetes   7. Dyslipidemia     Plan:  - Pap: Pap Co Test performed.  - Mammogram: Ordered. - Stool Guaiac Testing:  Not Ordered, up to date.  - Labs: Reviewed. Up to date.  - Routine preventative health maintenance measures emphasized: Exercise/Diet/Weight control, Tobacco Warnings, Alcohol/Substance use risks, Stress Management, Peer Pressure Issues and Safe Sex - Menopausal vasomotor symptoms, currently on HRT.  Has been on medication ~ 7 or 8 years.  Has previously been counseled on risks of continued use and encouragement on weaning. Is doing well on medication without issues.  - Subclinical hypothyroidism, not on meds.  Will continue to "just deal with" her cold hands/feet.   - Stress incontinence present, mild cystocele present, weakened pelvic floor. Encouraged performing Kegel exercises. Declined physical therapy and pessary options last year. Offered OTC option of Poise Impressa vaginal inserts. Patient notes she will look into this.  - Return to [place] for annual exam.    Rubie Maid, MD [facility]

## 2020-07-08 ENCOUNTER — Encounter: Payer: Self-pay | Admitting: Obstetrics and Gynecology

## 2020-07-11 LAB — CYTOLOGY - PAP
Comment: NEGATIVE
Diagnosis: NEGATIVE
High risk HPV: NEGATIVE

## 2020-10-04 ENCOUNTER — Other Ambulatory Visit: Payer: Self-pay

## 2020-10-06 ENCOUNTER — Ambulatory Visit
Admission: RE | Admit: 2020-10-06 | Discharge: 2020-10-06 | Disposition: A | Discharge status: Home / Self Care | Payer: BC Managed Care – PPO | Attending: Urology | Admitting: Urology

## 2020-10-06 ENCOUNTER — Other Ambulatory Visit: Payer: Self-pay

## 2020-10-06 ENCOUNTER — Other Ambulatory Visit: Payer: Self-pay | Admitting: *Deleted

## 2020-10-06 ENCOUNTER — Ambulatory Visit (INDEPENDENT_AMBULATORY_CARE_PROVIDER_SITE_OTHER): Payer: BC Managed Care – PPO | Admitting: Urology

## 2020-10-06 ENCOUNTER — Ambulatory Visit
Admission: RE | Admit: 2020-10-06 | Discharge: 2020-10-06 | Disposition: A | Discharge status: Home / Self Care | Payer: BC Managed Care – PPO | Source: Ambulatory Visit | Attending: Urology | Admitting: Urology

## 2020-10-06 ENCOUNTER — Encounter: Payer: Self-pay | Admitting: Urology

## 2020-10-06 VITALS — BP 129/76 | HR 74 | Ht 65.0 in | Wt 120.0 lb

## 2020-10-06 DIAGNOSIS — N200 Calculus of kidney: Secondary | ICD-10-CM

## 2020-10-06 DIAGNOSIS — R319 Hematuria, unspecified: Secondary | ICD-10-CM | POA: Diagnosis not present

## 2020-10-06 DIAGNOSIS — Z87442 Personal history of urinary calculi: Secondary | ICD-10-CM

## 2020-10-06 DIAGNOSIS — R310 Gross hematuria: Secondary | ICD-10-CM | POA: Diagnosis not present

## 2020-10-06 DIAGNOSIS — N393 Stress incontinence (female) (male): Secondary | ICD-10-CM

## 2020-10-07 ENCOUNTER — Telehealth: Payer: Self-pay | Admitting: *Deleted

## 2020-10-07 ENCOUNTER — Encounter: Payer: Self-pay | Admitting: Urology

## 2020-10-07 LAB — URINALYSIS, COMPLETE
Bilirubin, UA: NEGATIVE
Glucose, UA: NEGATIVE
Ketones, UA: NEGATIVE
Nitrite, UA: NEGATIVE
Protein,UA: NEGATIVE
RBC, UA: NEGATIVE
Specific Gravity, UA: <1.005 — ABNORMAL LOW (ref 1.005–1.030)
Urobilinogen, Ur: 0.2 mg/dL (ref 0.2–1.0)
pH, UA: 6.5 (ref 5.0–7.5)

## 2020-10-07 LAB — MICROSCOPIC EXAMINATION

## 2020-10-07 NOTE — Telephone Encounter (Signed)
Notified patient as instructed,.  

## 2020-10-07 NOTE — Telephone Encounter (Signed)
-----   Message from [name], MD sent at 10/07/2020 12:01 PM EDT ----- Please let Ms. Walker know her urinalysis yesterday did not show any red blood cells but did show white blood cells and a urine culture has been ordered.

## 2020-10-07 NOTE — Progress Notes (Signed)
10/06/2020 7:05 AM   Sharon Le 07-14-1955 [id number]  Referring provider: Rusty Aus, MD Sharon Le,  Sharon Le [id number]  Chief Complaint  Patient presents with  . Hematuria    HPI: Sharon Le is a 65 y.o. female who requested an appointment for evaluation of possible kidney stone.   Prior history of stone disease and I saw her in 2013 for same  Noted onset of gross hematuria a.m. 5/17  States notes gross hematuria in the morning which improves throughout the day  Denies dysuria or flank, abdominal, pelvic pain  Does have bothersome stress urinary incontinence  No anticoagulant/antiplatelet therapy   PMH: Past Medical History:  Diagnosis Date  . Abnormal TSH   . Adenomatous colon polyp   . Anemia   . Basal cell carcinoma   . Complication of anesthesia    HARD TIME WAKING UP AFTER HYSTERECTOMY  . Decreased fibrinogen ([place])   . Elevated TSH   . Family history of adverse reaction to anesthesia    PT UNSURE  . Fatigue   . History of kidney stones   . Intraductal papilloma of breast, left 2019  . MVP (mitral valve prolapse)    DX AT AGE 42-PT ASYMPTOMATIC AND HAS NEVER SEEN CARDIOLOGIST OR HAD ECHO DONE  . Osteopenia   . Retinal tear   . Rosacea   . Vasomotor instability   . Yeast infection     Surgical History: Past Surgical History:  Procedure Laterality Date  . BASAL CELL CARCINOMA EXCISION  abd  . BREAST DUCTAL SYSTEM EXCISION Left 06/07/2017   Procedure: EXCISION DUCTAL SYSTEM BREAST;  Surgeon: Robert Bellow, MD;  Location: [facility] ORS;  Service: General;  Laterality: Left;  . BREAST EXCISIONAL BIOPSY Left 06/07/2017   SCLEROSING INTRADUCTAL PAPILLOMA, 4 MM  . COLONOSCOPY WITH PROPOFOL N/A 10/05/2015   Procedure: COLONOSCOPY WITH PROPOFOL;  Surgeon: Manya Silvas, MD;  Location: [facility] ENDOSCOPY;  Service: Endoscopy;  Laterality: N/A;  . VAGINAL HYSTERECTOMY  2010   [facility]  . WISDOM TOOTH  EXTRACTION      Home Medications:  Allergies as of 10/06/2020   No Known Allergies     Medication List       Accurate as of Oct 06, 2020 11:59 PM. If you have any questions, ask your nurse or doctor.        aspirin-acetaminophen-caffeine 250-250-65 MG tablet Commonly known as: EXCEDRIN MIGRAINE Take 1 tablet by mouth every 6 (six) hours as needed for headache.   Azelaic Acid 15 % gel Apply 1 application topically daily.   estradiol 0.025 MG/24HR Commonly known as: VIVELLE-DOT APPLY 1 PATCH ONTO THE SKIN 2 TIMES A WEEK   olopatadine 0.1 % ophthalmic solution Commonly known as: PATANOL Place 1 drop into both eyes 2 (two) times daily as needed for allergies (seasonal).       Allergies: No Known Allergies  Family History: Family History  Problem Relation Age of Onset  . Vascular Disease Father   . Colon cancer Paternal Aunt   . Heart disease Maternal Grandmother   . Breast cancer Maternal Aunt 65  . Diabetes Neg Hx   . Ovarian cancer Neg Hx     Social History:  reports that she has never smoked. She has never used smokeless tobacco. She reports that she does not drink alcohol and does not use drugs.   Physical Exam: BP 129/76   Pulse 74   Ht 5\' 5"  (1.651  m)   Wt 120 lb (54.4 kg)   BMI 19.97 kg/m   Constitutional:  Alert and oriented, No acute distress. HEENT: [place] AT, moist mucus membranes.  Trachea midline, no masses. Cardiovascular: No clubbing, cyanosis, or edema. Respiratory: Normal respiratory effort, no increased work of breathing. Skin: No rashes, bruises or suspicious lesions. Neurologic: Grossly intact, no focal deficits, moving all 4 extremities. Psychiatric: Normal mood and affect.  Laboratory Data:  Urinalysis Pending  Pertinent Imaging: KUB performed today was personally reviewed and interpreted.  Moderate mount of stool and bowel gas obscuring the renal outlines bilaterally.  No definite calcifications suspicious for recurrent stone disease  identified.  Coarse calcification in the right renal pelvis  Assessment & Plan:    1.  Gross hematuria  Asymptomatic making a urinary tract calculus a less likely etiology  AUA hematuria risk stratification is high and we discussed the standard recommended evaluation for high risk hematuria to include CT urogram and cystoscopy  Current IV contrast shortage was discussed and it has been requested that studies requiring IV contrast be delayed ~ 1 month unless urgent/emergent  Cystoscopy was discussed and scheduled  If she develops flank pain/renal colic will call back and a stone protocol CT will be scheduled  2.  Stress urinary incontinence  Treatment options were discussed including pelvic floor physical therapy and surgical options.  She is not interested in surgical therapy but would be interested in pelvic floor PT and a referral was placed  3.  History urinary tract calculi    Sharon Sons, MD  [place] [address], [place], [place] [address] [phone]

## 2020-10-10 ENCOUNTER — Encounter: Payer: Self-pay | Admitting: Urology

## 2020-10-10 LAB — CULTURE, URINE COMPREHENSIVE

## 2020-10-11 ENCOUNTER — Encounter: Payer: Self-pay | Admitting: Urology

## 2020-10-11 ENCOUNTER — Telehealth: Payer: Self-pay | Admitting: *Deleted

## 2020-10-11 ENCOUNTER — Other Ambulatory Visit: Payer: Self-pay | Admitting: Urology

## 2020-10-11 MED ORDER — SULFAMETHOXAZOLE-TRIMETHOPRIM 800-160 MG PO TABS: 1.0000 | ORAL_TABLET | Freq: Two times a day (BID) | ORAL | 0 refills | Status: DC

## 2020-10-11 NOTE — Telephone Encounter (Signed)
Notified patient as instructed, patient pleased. Discussed follow-up appointments, patient agrees  

## 2020-10-11 NOTE — Telephone Encounter (Signed)
-----   Message from [name], MD sent at 10/11/2020  7:26 AM EDT ----- Urine culture was growing bacteria which could also be a potential source of her hematuria.  Antibiotic Rx was sent to pharmacy.  Schedule cystoscopy as per my chart message from yesterday

## 2020-10-13 MED ORDER — CEFUROXIME AXETIL 250 MG PO TABS: 250.0000 mg | ORAL_TABLET | Freq: Two times a day (BID) | ORAL | 0 refills | Status: AC

## 2020-10-13 NOTE — Telephone Encounter (Signed)
Discontinue the Septra.  Rx cefuroxime sent to pharmacy.

## 2020-10-13 NOTE — Addendum Note (Signed)
Addended by: John Giovanni C on: 10/13/2020 12:12 PM   Modules accepted: Orders

## 2020-10-13 NOTE — Telephone Encounter (Signed)
Patient left a vmail on triage line stating abx are making her very dizzy, she is not sure she will be able to continue taking. Attempted to return call no answer, left vmail to call back.  Please advise if abx can be changed or if patient should continue taking

## 2020-10-13 NOTE — Telephone Encounter (Signed)
Tried to call patient goes to voice mail.  Sent my chart message.

## 2020-10-27 ENCOUNTER — Other Ambulatory Visit: Payer: Self-pay

## 2020-10-27 ENCOUNTER — Encounter: Payer: Self-pay | Admitting: Urology

## 2020-10-27 ENCOUNTER — Ambulatory Visit: Payer: BC Managed Care – PPO | Admitting: Urology

## 2020-10-27 VITALS — BP 114/74 | HR 78 | Ht 65.0 in | Wt 120.0 lb

## 2020-10-27 DIAGNOSIS — R310 Gross hematuria: Secondary | ICD-10-CM | POA: Diagnosis not present

## 2020-10-27 LAB — URINALYSIS, COMPLETE
Bilirubin, UA: NEGATIVE
Glucose, UA: NEGATIVE
Ketones, UA: NEGATIVE
Nitrite, UA: NEGATIVE
Protein,UA: NEGATIVE
RBC, UA: NEGATIVE
Specific Gravity, UA: 1.01 (ref 1.005–1.030)
Urobilinogen, Ur: 0.2 mg/dL (ref 0.2–1.0)
pH, UA: 7 (ref 5.0–7.5)

## 2020-10-27 LAB — MICROSCOPIC EXAMINATION

## 2020-10-27 NOTE — Progress Notes (Signed)
   10/27/20  CC:  Chief Complaint  Patient presents with   Hematuria    HPI: Refer to my previous note 10/06/2020.  CT urogram scheduled 11/07/2020.  Cystoscopy today due to a coarse calcification seen on KUB which unlikely is a bladder calculus (see radiology report).  Denies recurrent gross hematuria  Blood pressure 114/74, pulse 78, height 5\' 5"  (1.651 m), weight 120 lb (54.4 kg). NED. A&Ox3.   No respiratory distress   Abd soft, NT, ND Atrophic external genitalia with patent urethral meatus  Cystoscopy Procedure Note  Patient identification was confirmed, informed consent was obtained, and patient was prepped using Betadine solution.  Lidocaine jelly was administered per urethral meatus.    Procedure: - Flexible cystoscope introduced, without any difficulty.   - Thorough search of the bladder revealed:    normal urethral meatus    normal urothelium    no stones    no ulcers     no tumors    no urethral polyps    no trabeculation  - Ureteral orifices were normal in position and appearance.  Post-Procedure: - Patient tolerated the procedure well  Assessment/ Plan: Unremarkable cystoscopy; no bladder mucosal abnormality seen CTU scheduled 11/07/2020 Urinalysis today did show pyuria and a urine culture was ordered   [name], MD

## 2020-10-30 LAB — CULTURE, URINE COMPREHENSIVE

## 2020-10-31 ENCOUNTER — Encounter: Payer: Self-pay | Admitting: *Deleted

## 2020-11-02 ENCOUNTER — Encounter: Payer: Self-pay | Admitting: *Deleted

## 2020-11-04 ENCOUNTER — Encounter: Payer: Self-pay | Admitting: Urology

## 2020-11-07 ENCOUNTER — Ambulatory Visit: Payer: BC Managed Care – PPO

## 2020-11-10 ENCOUNTER — Telehealth: Payer: Self-pay | Admitting: *Deleted

## 2020-11-10 NOTE — Telephone Encounter (Signed)
Patient called left a VM on triage line regarding ordering a CT scan at a new facility due to her insurance coverage.

## 2020-11-14 ENCOUNTER — Other Ambulatory Visit: Payer: Self-pay | Admitting: Urology

## 2020-11-14 DIAGNOSIS — R310 Gross hematuria: Secondary | ICD-10-CM

## 2020-11-22 ENCOUNTER — Encounter: Payer: Self-pay | Admitting: Urology

## 2020-12-09 ENCOUNTER — Ambulatory Visit
Admission: RE | Admit: 2020-12-09 | Discharge: 2020-12-09 | Disposition: A | Discharge status: Home / Self Care | Payer: BC Managed Care – PPO | Source: Ambulatory Visit | Attending: Urology | Admitting: Urology

## 2020-12-09 ENCOUNTER — Other Ambulatory Visit: Payer: Self-pay

## 2020-12-09 DIAGNOSIS — R310 Gross hematuria: Secondary | ICD-10-CM

## 2020-12-09 DIAGNOSIS — K824 Cholesterolosis of gallbladder: Secondary | ICD-10-CM | POA: Diagnosis not present

## 2020-12-09 DIAGNOSIS — N200 Calculus of kidney: Secondary | ICD-10-CM | POA: Diagnosis not present

## 2020-12-09 DIAGNOSIS — R918 Other nonspecific abnormal finding of lung field: Secondary | ICD-10-CM | POA: Diagnosis not present

## 2020-12-09 MED ORDER — IOPAMIDOL (ISOVUE-300) INJECTION 61%
125.0000 mL | Freq: Once | INTRAVENOUS | Status: AC | PRN
  Administered 2020-12-09: 125 mL via INTRAVENOUS

## 2020-12-12 ENCOUNTER — Encounter: Payer: Self-pay | Admitting: *Deleted

## 2020-12-12 ENCOUNTER — Telehealth: Payer: Self-pay

## 2020-12-12 NOTE — Telephone Encounter (Signed)
The radiologist did not comment on the pelvic calcification.  On my review it is far away from the bladder and may be located in the bowel.  The radiologist also commented that the uterus was normal.  There is tissue present where the uterus should be.  I have a call into the radiologist who read the report.  He had left for the day and will contact me tomorrow after he reviews.  Will let the patient know what he thinks about both of these findings.

## 2020-12-12 NOTE — Telephone Encounter (Signed)
Incoming message from pt on triage line who states that she would like to know the next steps based on her CT Scan. She states she sent a mychart message but has not received a response. Please advise.

## 2020-12-15 ENCOUNTER — Encounter: Payer: Self-pay | Admitting: Urology

## 2020-12-22 ENCOUNTER — Other Ambulatory Visit: Payer: Self-pay | Admitting: Family Medicine

## 2020-12-22 DIAGNOSIS — Z87442 Personal history of urinary calculi: Secondary | ICD-10-CM

## 2021-01-28 ENCOUNTER — Other Ambulatory Visit: Payer: Self-pay | Admitting: Obstetrics and Gynecology

## 2021-02-22 ENCOUNTER — Other Ambulatory Visit: Payer: Self-pay

## 2021-02-22 ENCOUNTER — Ambulatory Visit: Payer: BC Managed Care – PPO

## 2021-02-22 DIAGNOSIS — Z23 Encounter for immunization: Secondary | ICD-10-CM

## 2021-03-15 ENCOUNTER — Other Ambulatory Visit: Payer: Self-pay | Admitting: Obstetrics and Gynecology

## 2021-03-15 DIAGNOSIS — Z1231 Encounter for screening mammogram for malignant neoplasm of breast: Secondary | ICD-10-CM

## 2021-05-01 ENCOUNTER — Other Ambulatory Visit: Payer: Self-pay

## 2021-05-01 ENCOUNTER — Ambulatory Visit
Admission: RE | Admit: 2021-05-01 | Discharge: 2021-05-01 | Disposition: A | Discharge status: Home / Self Care | Payer: BC Managed Care – PPO | Source: Ambulatory Visit | Attending: Obstetrics and Gynecology | Admitting: Obstetrics and Gynecology

## 2021-05-01 DIAGNOSIS — Z1231 Encounter for screening mammogram for malignant neoplasm of breast: Secondary | ICD-10-CM

## 2021-05-29 DIAGNOSIS — Z85828 Personal history of other malignant neoplasm of skin: Secondary | ICD-10-CM | POA: Diagnosis not present

## 2021-05-29 DIAGNOSIS — D2261 Melanocytic nevi of right upper limb, including shoulder: Secondary | ICD-10-CM | POA: Diagnosis not present

## 2021-05-29 DIAGNOSIS — D2262 Melanocytic nevi of left upper limb, including shoulder: Secondary | ICD-10-CM | POA: Diagnosis not present

## 2021-05-29 DIAGNOSIS — D2271 Melanocytic nevi of right lower limb, including hip: Secondary | ICD-10-CM | POA: Diagnosis not present

## 2021-06-15 ENCOUNTER — Telehealth: Payer: Self-pay | Admitting: Obstetrics and Gynecology

## 2021-06-15 DIAGNOSIS — Z0000 Encounter for general adult medical examination without abnormal findings: Secondary | ICD-10-CM

## 2021-06-15 NOTE — Telephone Encounter (Signed)
Rescheduled pt physical to 3-16, she is requesting her annual labs be completed prior to visit so they can be reviewed with her at apt- she requested order be faxed to [facility]. Please Advsie.

## 2021-06-16 NOTE — Telephone Encounter (Signed)
Please print patient's lab orders and fax to the requested facility.  Please inform patient to be fasting when she goes to perform her labs.

## 2021-06-19 NOTE — Telephone Encounter (Signed)
Labs were faxed to [facility]. [phone].

## 2021-07-13 ENCOUNTER — Other Ambulatory Visit: Payer: BC Managed Care – PPO

## 2021-07-13 ENCOUNTER — Other Ambulatory Visit: Payer: Self-pay

## 2021-07-13 ENCOUNTER — Encounter: Payer: BC Managed Care – PPO | Admitting: Obstetrics and Gynecology

## 2021-07-13 DIAGNOSIS — Z0000 Encounter for general adult medical examination without abnormal findings: Secondary | ICD-10-CM

## 2021-07-14 LAB — CBC WITH DIFFERENTIAL/PLATELET
Basophils Absolute: 0 10*3/uL (ref 0.0–0.2)
Basos: 1 %
EOS (ABSOLUTE): 0.1 10*3/uL (ref 0.0–0.4)
Eos: 1 %
Hematocrit: 39.7 % (ref 34.0–46.6)
Hemoglobin: 12.9 g/dL (ref 11.1–15.9)
Immature Grans (Abs): 0 10*3/uL (ref 0.0–0.1)
Immature Granulocytes: 0 %
Lymphocytes Absolute: 1.4 10*3/uL (ref 0.7–3.1)
Lymphs: 29 %
MCH: 31.2 pg (ref 26.6–33.0)
MCHC: 32.5 g/dL (ref 31.5–35.7)
MCV: 96 fL (ref 79–97)
Monocytes Absolute: 0.5 10*3/uL (ref 0.1–0.9)
Monocytes: 11 %
Neutrophils Absolute: 2.8 10*3/uL (ref 1.4–7.0)
Neutrophils: 58 %
Platelets: 202 10*3/uL (ref 150–450)
RBC: 4.13 x10E6/uL (ref 3.77–5.28)
RDW: 12.3 % (ref 11.7–15.4)
WBC: 4.8 10*3/uL (ref 3.4–10.8)

## 2021-07-14 LAB — COMPREHENSIVE METABOLIC PANEL
ALT: 9 IU/L (ref 0–32)
AST: 15 IU/L (ref 0–40)
Albumin/Globulin Ratio: 2.4 — ABNORMAL HIGH (ref 1.2–2.2)
Albumin: 4.6 g/dL (ref 3.8–4.8)
Alkaline Phosphatase: 61 IU/L (ref 44–121)
BUN/Creatinine Ratio: 16 (ref 12–28)
BUN: 12 mg/dL (ref 8–27)
Bilirubin Total: 0.8 mg/dL (ref 0.0–1.2)
CO2: 26 mmol/L (ref 20–29)
Calcium: 9.1 mg/dL (ref 8.7–10.3)
Chloride: 102 mmol/L (ref 96–106)
Creatinine, Ser: 0.73 mg/dL (ref 0.57–1.00)
Globulin, Total: 1.9 g/dL (ref 1.5–4.5)
Glucose: 100 mg/dL — ABNORMAL HIGH (ref 70–99)
Potassium: 4.3 mmol/L (ref 3.5–5.2)
Sodium: 141 mmol/L (ref 134–144)
Total Protein: 6.5 g/dL (ref 6.0–8.5)
eGFR: 92 mL/min/{1.73_m2} (ref >59)

## 2021-07-14 LAB — LIPID PANEL
Chol/HDL Ratio: 2.7 ratio (ref 0.0–4.4)
Cholesterol, Total: 181 mg/dL (ref 100–199)
HDL: 67 mg/dL (ref >39)
LDL Chol Calc (NIH): 103 mg/dL — ABNORMAL HIGH (ref 0–99)
Triglycerides: 54 mg/dL (ref 0–149)
VLDL Cholesterol Cal: 11 mg/dL (ref 5–40)

## 2021-07-14 LAB — HGB A1C W/O EAG: Hgb A1c MFr Bld: 5.9 % — ABNORMAL HIGH (ref 4.8–5.6)

## 2021-08-03 ENCOUNTER — Encounter: Payer: BC Managed Care – PPO | Admitting: Obstetrics and Gynecology

## 2021-09-11 NOTE — Progress Notes (Signed)
? ?ANNUAL PREVENTATIVE CARE GYNECOLOGY  ENCOUNTER NOTE ? ?Subjective:  ?    ? Sharon Le is a 66 y.o. G0P0000 female here for a routine annual gynecologic exam. The patient is not currently sexually active. The patient is taking hormone replacement therapy. Patient denies post-menopausal vaginal bleeding.  Has the patient ever been transfused or tattooed?: no ? ?Current complaints: ?1.  Thinking about discontinuing HRT.  Notes that she forgets to use the patch sometimes and has not noted any side effects. ?2. Had a CT scan performed in July last year.  Has questions about lung nodules and if further imaging is warranted.  ?3. She notes she is still experiencing urinary leakage.  Saw Urologist sometime last year and underwent workup for hematuria. No significant findings, but did also recommend pelvic floor physical therapy for management of leakage.   ? ?  ?Gynecologic History ?No LMP recorded. Patient has had a hysterectomy. ?Contraception: post menopausal status and patient has had hysterectomy ?Last Pap: . Results were: normal ?Last mammogram: 07/07/2020. Results were: normal. Remote history of abnormal pap smears.  ?Last Colonoscopy: 09/2015. Needs repeat in 5 years due to colon polyp ?Last Dexa Scan: patient has never had one.                                        ? ? ?Obstetric History ?OB History  ?Gravida Para Term Preterm AB Living  ?0 0 0 0 0 0  ?SAB IAB Ectopic Multiple Live Births  ?0 0 0 0    ?Obstetric Comments  ?1st Menstrual Cycle:  16   ?  ? ? ?Past Medical History:  ?Diagnosis Date  ? Abnormal TSH   ? Adenomatous colon polyp   ? Anemia   ? Basal cell carcinoma   ? Complication of anesthesia   ? HARD TIME WAKING UP AFTER HYSTERECTOMY  ? Decreased fibrinogen ([facility])   ? Elevated TSH   ? Family history of adverse reaction to anesthesia   ? PT UNSURE  ? Fatigue   ? History of kidney stones   ? Intraductal papilloma of breast, left 2019  ? MVP (mitral valve prolapse)   ? DX AT AGE 49-PT ASYMPTOMATIC  AND HAS NEVER SEEN CARDIOLOGIST OR HAD ECHO DONE  ? Osteopenia   ? Retinal tear   ? Rosacea   ? Vasomotor instability   ? Yeast infection   ? ? ?Family History  ?Problem Relation Age of Onset  ? Vascular Disease Father   ? Colon cancer Paternal Aunt   ? Heart disease Maternal Grandmother   ? Breast cancer Maternal Aunt 59  ? Diabetes Neg Hx   ? Ovarian cancer Neg Hx   ? ? ?Past Surgical History:  ?Procedure Laterality Date  ? BASAL CELL CARCINOMA EXCISION  abd  ? BREAST DUCTAL SYSTEM EXCISION Left 06/07/2017  ? Procedure: EXCISION DUCTAL SYSTEM BREAST;  Surgeon: Robert Bellow, MD;  Location: [facility] ORS;  Service: General;  Laterality: Left;  ? BREAST EXCISIONAL BIOPSY Left 06/07/2017  ? SCLEROSING INTRADUCTAL PAPILLOMA, 4 MM  ? COLONOSCOPY WITH PROPOFOL N/A 10/05/2015  ? Procedure: COLONOSCOPY WITH PROPOFOL;  Surgeon: Manya Silvas, MD;  Location: [facility] ENDOSCOPY;  Service: Endoscopy;  Laterality: N/A;  ? VAGINAL HYSTERECTOMY  2010  ? [facility]  ? WISDOM TOOTH EXTRACTION    ? ? ?Social History  ? ?Socioeconomic History  ? Marital status:  Single  ?  Spouse name: Not on file  ? Number of children: Not on file  ? Years of education: Not on file  ? Highest education level: Not on file  ?Occupational History  ? Not on file  ?Tobacco Use  ? Smoking status: Never  ? Smokeless tobacco: Never  ?Vaping Use  ? Vaping Use: Never used  ?Substance and Sexual Activity  ? Alcohol use: No  ? Drug use: No  ? Sexual activity: Not Currently  ?Other Topics Concern  ? Not on file  ?Social History Narrative  ? Not on file  ? ?Social Determinants of Health  ? ?Financial Resource Strain: Not on file  ?Food Insecurity: Not on file  ?Transportation Needs: Not on file  ?Physical Activity: Not on file  ?Stress: Not on file  ?Social Connections: Not on file  ?Intimate Partner Violence: Not on file  ? ? ?Current Outpatient Medications on File Prior to Visit  ?Medication Sig Dispense Refill  ? aspirin-acetaminophen-caffeine (EXCEDRIN MIGRAINE)  250-250-65 MG tablet Take 1 tablet by mouth every 6 (six) hours as needed for headache.    ? Azelaic Acid 15 % cream Apply 1 application topically daily.     ? estradiol (VIVELLE-DOT) 0.025 MG/24HR APPLY 1 PATCH ONTO THE SKIN 2 TIMES A WEEK 24 patch 3  ? olopatadine (PATANOL) 0.1 % ophthalmic solution Place 1 drop into both eyes 2 (two) times daily as needed for allergies (seasonal).     ? ?No current facility-administered medications on file prior to visit.  ? ? ?No Known Allergies ? ? ?Review of Systems ?ROS ?Review of Systems - General ROS: negative for - chills, fatigue, fever, hot flashes, night sweats, weight gain or weight loss ?Psychological ROS: negative for - anxiety, decreased libido, depression, mood swings, physical abuse or sexual abuse ?Ophthalmic ROS: negative for - blurry vision, eye pain or loss of vision ?ENT ROS: negative for - headaches, hearing change, visual changes or vocal changes ?Allergy and Immunology ROS: negative for - hives, itchy/watery eyes or seasonal allergies ?Hematological and Lymphatic ROS: negative for - bleeding problems, bruising, swollen lymph nodes or weight loss ?Endocrine ROS: negative for - galactorrhea, hair pattern changes, hot flashes, malaise/lethargy, mood swings, palpitations, polydipsia/polyuria, skin changes,  or unexpected weight changes. ?Breast ROS: negative for - new or changing breast lumps or nipple discharge ?Respiratory ROS: negative for - cough or shortness of breath ?Cardiovascular ROS: negative for - chest pain, irregular heartbeat, palpitations or shortness of breath ?Gastrointestinal ROS: no abdominal pain, change in bowel habits, or black or bloody stools.   ?Genito-Urinary ROS: no dysuria, trouble voiding, or hematuria. Positive for urinary leakage with exercising, coughing.  ?Musculoskeletal ROS: negative for - joint pain or joint stiffness ?Neurological ROS: negative for - bowel and bladder control changes ?Dermatological ROS: negative for rash  and skin lesion changes ?  ?Objective:  ? ?BP 116/60   Pulse 61   Resp 16   Ht '5\' 5"'$  (1.651 m)   Wt 118 lb 9.6 oz (53.8 kg)   BMI 19.74 kg/m?  ?CONSTITUTIONAL: Well-developed, well-nourished female in no acute distress.  ?PSYCHIATRIC: Normal mood and affect. Normal behavior. Normal judgment and thought content. ?[place]: Alert and oriented to person, place, and time. Normal muscle tone coordination. No cranial nerve deficit noted. ?HENT:  Normocephalic, atraumatic, External right and left ear normal. Oropharynx is clear and moist ?EYES: Conjunctivae and EOM are normal. Pupils are equal, round, and reactive to light. No scleral icterus.  ?NECK: Normal  range of motion, supple, no masses.  Normal thyroid.  ?SKIN: Skin is warm and dry. No rash noted. Not diaphoretic. No erythema. No pallor. ?CARDIOVASCULAR: Normal heart rate noted, regular rhythm, no murmur. ?RESPIRATORY: Clear to auscultation bilaterally. Effort and breath sounds normal, no problems with respiration noted. ?BREASTS: Symmetric in size. No masses, skin changes, nipple drainage, or lymphadenopathy. ?ABDOMEN: Soft, normal bowel sounds, no distention noted.  No tenderness, rebound or guarding.  ?BLADDER: Normal ?PELVIC: ? Bladder no bladder distension noted ? Urethra: normal appearing urethra with no masses, tenderness or lesions ? Vulva: normal appearing vulva with no masses, tenderness or lesions ? Vagina: atrophic, with no lesions or discharge. Pelvic floor mildly weak. Grade 1 cystocele present.  ? Cervix: normal appearing cervix without discharge or lesions ? Uterus: surgically absent ? Adnexa: normal adnexa in size, nontender and no masses ? RV: External Exam NormaI, No Rectal Masses and Mildly weakened sphincter tone  ?MUSCULOSKELETAL: Normal range of motion. No tenderness.  No cyanosis, clubbing, or edema.  2+ distal pulses. ?LYMPHATIC: No Axillary, Supraclavicular, or Inguinal Adenopathy. ? ? ?Labs: ?Lab Results  ?Component Value Date  ? WBC  4.8 07/13/2021  ? HGB 12.9 07/13/2021  ? HCT 39.7 07/13/2021  ? MCV 96 07/13/2021  ? PLT 202 07/13/2021  ? ? ?Lab Results  ?Component Value Date  ? CREATININE 0.73 07/13/2021  ? BUN 12 07/13/2021  ? NA 141 02/23/

## 2021-09-11 NOTE — Patient Instructions (Signed)
Breast Self-Awareness ?Breast self-awareness is knowing how your breasts look and feel. You need to: ?Check your breasts on a regular basis. ?Tell your doctor about any changes. ?Become familiar with the look and feel of your breasts. This can help you catch a breast problem while it is still small and can be treated. You should do breast self-exams even if you have breast implants. ?What you need: ?A mirror. ?A well-lit room. ?A pillow or other soft object. ?How to do a breast self-exam ?Follow these steps to do a breast self-exam: ?Look for changes ? ?Take off all the clothes above your waist. ?Stand in front of a mirror in a room with good lighting. ?Put your hands down at your sides. ?Compare your breasts in the mirror. Look for any difference between them, such as: ?A difference in shape. ?A difference in size. ?Wrinkles, dips, and bumps in one breast and not the other. ?Look at each breast for changes in the skin, such as: ?Redness. ?Scaly areas. ?Skin that has gotten thicker. ?Dimpling. ?Open sores (ulcers). ?Look for changes in your nipples, such as: ?Fluid coming out of a nipple. ?Fluid around a nipple. ?Bleeding. ?Dimpling. ?Redness. ?A nipple that looks pushed in (retracted), or that has changed position. ?Feel for changes ?Lie on your back. ?Feel each breast. To do this: ?Pick a breast to feel. ?Place a pillow under the shoulder closest to that breast. Put the arm closest to that breast behind your head. ?Feel the nipple area of that breast using the hand of your other arm. Feel the area with the pads of your three middle fingers by making small circles with your fingers. Use light, medium, and firm pressure. ?Continue the overlapping circles, moving downward over the breast. Keep making circles with your fingers. Stop when you feel your ribs. ?Start making circles with your fingers again, this time going upward until you reach your collarbone. ?Then, make circles outward across your breast and into your  armpit area. ?Squeeze your nipple. Check for discharge and lumps. ?Repeat these steps to check your other breast. ?Sit or stand in the tub or shower. ?With soapy water on your skin, feel each breast the same way you did when you were lying down. ?Write down what you find ?Writing down what you find can help you remember what to tell your doctor. Write down: ?What is normal for each breast. ?Any changes you find in each breast. These include: ?The kind of changes you find. ?A tender or painful breast. ?Any lump you find. Write down its size and where it is. ?When you last had your monthly period (menstrual cycle). ?General tips ?If you are breastfeeding, the best time to check your breasts is after you feed your baby or after you use a breast pump. ?If you get monthly bleeding, the best time to check your breasts is 5-7 days after your monthly cycle ends. ?With time, you will become comfortable with the self-exam. You will also start to know if there are changes in your breasts. ?Contact a doctor if: ?You see a change in the shape or size of your breasts or nipples. ?You see a change in the skin of your breast or nipples, such as red or scaly skin. ?You have fluid coming from your nipples that is not normal. ?You find a new lump or thick area. ?You have breast pain. ?You have any concerns about your breast health. ?Summary ?Breast self-awareness includes looking for changes in your breasts and feeling for changes   within your breasts. ?You should do breast self-awareness in front of a mirror in a well-lit room. ?If you get monthly periods (menstrual cycles), the best time to check your breasts is 5-7 days after your period ends. ?Tell your doctor about any changes you see in your breasts. Changes include changes in size, changes on the skin, painful or tender breasts, or fluid from your nipples that is not normal. ?This information is not intended to replace advice given to you by your health care provider. Make sure  you discuss any questions you have with your health care provider. ?Document Revised: 03/09/2021 Document Reviewed: 03/09/2021 ?Elsevier Patient Education ? Sharon Le. ?Preventive Care 29-58 Years Old, Female ?Preventive care refers to lifestyle choices and visits with your health care provider that can promote health and wellness. Preventive care visits are also called wellness exams. ?What can I expect for my preventive care visit? ?Counseling ?Your health care provider may ask you questions about your: ?Medical history, including: ?Past medical problems. ?Family medical history. ?Pregnancy history. ?Current health, including: ?Menstrual cycle. ?Method of birth control. ?Emotional well-being. ?Home life and relationship well-being. ?Sexual activity and sexual health. ?Lifestyle, including: ?Alcohol, nicotine or tobacco, and drug use. ?Access to firearms. ?Diet, exercise, and sleep habits. ?Work and work Statistician. ?Sunscreen use. ?Safety issues such as seatbelt and bike helmet use. ?Physical exam ?Your health care provider will check your: ?Height and weight. These may be used to calculate your BMI (body mass index). BMI is a measurement that tells if you are at a healthy weight. ?Waist circumference. This measures the distance around your waistline. This measurement also tells if you are at a healthy weight and may help predict your risk of certain diseases, such as type 2 diabetes and high blood pressure. ?Heart rate and blood pressure. ?Body temperature. ?Skin for abnormal spots. ?What immunizations do I need? ? ?Vaccines are usually given at various ages, according to a schedule. Your health care provider will recommend vaccines for you based on your age, medical history, and lifestyle or other factors, such as travel or where you work. ?What tests do I need? ?Screening ?Your health care provider may recommend screening tests for certain conditions. This may include: ?Lipid and cholesterol  levels. ?Diabetes screening. This is done by checking your blood sugar (glucose) after you have not eaten for a while (fasting). ?Pelvic exam and Pap test. ?Hepatitis B test. ?Hepatitis C test. ?HIV (human immunodeficiency virus) test. ?STI (sexually transmitted infection) testing, if you are at risk. ?Lung cancer screening. ?Colorectal cancer screening. ?Mammogram. Talk with your health care provider about when you should start having regular mammograms. This may depend on whether you have a family history of breast cancer. ?BRCA-related cancer screening. This may be done if you have a family history of breast, ovarian, tubal, or peritoneal cancers. ?Bone density scan. This is done to screen for osteoporosis. ?Talk with your health care provider about your test results, treatment options, and if necessary, the need for more tests. ?Follow these instructions at home: ?Eating and drinking ? ?Eat a diet that includes fresh fruits and vegetables, whole grains, lean protein, and low-fat dairy products. ?Take vitamin and mineral supplements as recommended by your health care provider. ?Do not drink alcohol if: ?Your health care provider tells you not to drink. ?You are pregnant, may be pregnant, or are planning to become pregnant. ?If you drink alcohol: ?Limit how much you have to 0-1 drink a day. ?Know how much alcohol is in your  drink. In the [place] one drink equals one 12 oz bottle of beer (355 mL), one 5 oz glass of wine (148 mL), or one 1? oz glass of hard liquor (44 mL). ?Lifestyle ?Brush your teeth every morning and night with fluoride toothpaste. Floss one time each day. ?Exercise for at least 30 minutes 5 or more days each week. ?Do not use any products that contain nicotine or tobacco. These products include cigarettes, chewing tobacco, and vaping devices, such as e-cigarettes. If you need help quitting, ask your health care provider. ?Do not use drugs. ?If you are sexually active, practice safe sex. Use a  condom or other form of protection to prevent STIs. ?If you do not wish to become pregnant, use a form of birth control. If you plan to become pregnant, see your health care provider for a prepregnancy visit. ?Take

## 2021-09-13 ENCOUNTER — Ambulatory Visit (INDEPENDENT_AMBULATORY_CARE_PROVIDER_SITE_OTHER): Payer: BC Managed Care – PPO | Admitting: Obstetrics and Gynecology

## 2021-09-13 ENCOUNTER — Encounter: Payer: Self-pay | Admitting: Obstetrics and Gynecology

## 2021-09-13 VITALS — BP 116/60 | HR 61 | Resp 16 | Ht 65.0 in | Wt 118.6 lb

## 2021-09-13 DIAGNOSIS — N393 Stress incontinence (female) (male): Secondary | ICD-10-CM | POA: Diagnosis not present

## 2021-09-13 DIAGNOSIS — Z79890 Hormone replacement therapy (postmenopausal): Secondary | ICD-10-CM

## 2021-09-13 DIAGNOSIS — E038 Other specified hypothyroidism: Secondary | ICD-10-CM

## 2021-09-13 DIAGNOSIS — N951 Menopausal and female climacteric states: Secondary | ICD-10-CM

## 2021-09-13 DIAGNOSIS — R7303 Prediabetes: Secondary | ICD-10-CM

## 2021-09-13 DIAGNOSIS — E785 Hyperlipidemia, unspecified: Secondary | ICD-10-CM

## 2021-09-13 DIAGNOSIS — Z01419 Encounter for gynecological examination (general) (routine) without abnormal findings: Secondary | ICD-10-CM | POA: Diagnosis not present

## 2021-12-01 ENCOUNTER — Encounter: Payer: Self-pay | Admitting: Obstetrics and Gynecology

## 2021-12-15 ENCOUNTER — Ambulatory Visit
Admission: RE | Admit: 2021-12-15 | Discharge: 2021-12-15 | Disposition: A | Discharge status: Home / Self Care | Payer: BC Managed Care – PPO | Source: Ambulatory Visit | Attending: Urology | Admitting: Urology

## 2021-12-15 ENCOUNTER — Ambulatory Visit
Admission: RE | Admit: 2021-12-15 | Discharge: 2021-12-15 | Disposition: A | Discharge status: Home / Self Care | Payer: BC Managed Care – PPO | Attending: Urology | Admitting: Urology

## 2021-12-15 DIAGNOSIS — Z87442 Personal history of urinary calculi: Secondary | ICD-10-CM | POA: Diagnosis not present

## 2021-12-21 ENCOUNTER — Ambulatory Visit: Payer: BC Managed Care – PPO | Admitting: Urology

## 2021-12-21 ENCOUNTER — Encounter: Payer: Self-pay | Admitting: Urology

## 2021-12-21 ENCOUNTER — Encounter: Payer: Self-pay | Admitting: Obstetrics and Gynecology

## 2021-12-21 VITALS — BP 130/68 | HR 68 | Ht 65.0 in | Wt 113.0 lb

## 2021-12-21 DIAGNOSIS — R310 Gross hematuria: Secondary | ICD-10-CM

## 2021-12-21 DIAGNOSIS — N200 Calculus of kidney: Secondary | ICD-10-CM

## 2021-12-21 DIAGNOSIS — Z87442 Personal history of urinary calculi: Secondary | ICD-10-CM

## 2021-12-21 DIAGNOSIS — R911 Solitary pulmonary nodule: Secondary | ICD-10-CM | POA: Diagnosis not present

## 2021-12-21 DIAGNOSIS — Z87898 Personal history of other specified conditions: Secondary | ICD-10-CM

## 2021-12-21 DIAGNOSIS — N951 Menopausal and female climacteric states: Secondary | ICD-10-CM

## 2021-12-21 LAB — MICROSCOPIC EXAMINATION

## 2021-12-21 LAB — URINALYSIS, COMPLETE
Bilirubin, UA: NEGATIVE
Glucose, UA: NEGATIVE
Ketones, UA: NEGATIVE
Nitrite, UA: NEGATIVE
Protein,UA: NEGATIVE
Specific Gravity, UA: 1.02 (ref 1.005–1.030)
Urobilinogen, Ur: 0.2 mg/dL (ref 0.2–1.0)
pH, UA: 6.5 (ref 5.0–7.5)

## 2021-12-21 NOTE — Progress Notes (Signed)
12/21/2021 1:30 PM   ZOHAL RENY 10/17/1955 [id number]  Referring provider: Rusty Aus, MD [facility] [place],  [place] [address]  Chief Complaint  Patient presents with   Nephrolithiasis   Urologic history: 1.  Gross hematuria Evaluated 10/2020 CTU 5 mm left lower pole calculus Cystoscopy no abnormalities  2.  Stress urinary incontinence   HPI: 66 y.o. female presents for annual follow-up.  No significant urologic problems since last years visit Denies recurrent gross hematuria The coarse calcification in the right pelvis is very posterior and felt represent an area of fat necrosis Is interested in pursuing pelvic floor PT for stress incontinence the his too busy at this time   PMH: Past Medical History:  Diagnosis Date   Abnormal TSH    Adenomatous colon polyp    Anemia    Basal cell carcinoma    Complication of anesthesia    HARD TIME WAKING UP AFTER HYSTERECTOMY   Decreased fibrinogen (HCC)    Elevated TSH    Family history of adverse reaction to anesthesia    PT UNSURE   Fatigue    History of kidney stones    Intraductal papilloma of breast, left 2019   MVP (mitral valve prolapse)    DX AT AGE 75-PT ASYMPTOMATIC AND HAS NEVER SEEN CARDIOLOGIST OR HAD ECHO DONE   Osteopenia    Retinal tear    Rosacea    Vasomotor instability    Yeast infection     Surgical History: Past Surgical History:  Procedure Laterality Date   BASAL CELL CARCINOMA EXCISION  abd   BREAST DUCTAL SYSTEM EXCISION Left 06/07/2017   Procedure: EXCISION DUCTAL SYSTEM BREAST;  Surgeon: Robert Bellow, MD;  Location: [facility] ORS;  Service: General;  Laterality: Left;   BREAST EXCISIONAL BIOPSY Left 06/07/2017   SCLEROSING INTRADUCTAL PAPILLOMA, 4 MM   COLONOSCOPY WITH PROPOFOL N/A 10/05/2015   Procedure: COLONOSCOPY WITH PROPOFOL;  Surgeon: Manya Silvas, MD;  [facility] END[facility]COPY;  Service: Endoscopy;  Laterality: N/A;    VAGINAL HYSTERECTOMY  2010   [facility]   WISDOM TOOTH EXTRACTION      Home Medications:  Allergies as of 12/21/2021   No Known Allergies      Medication List        Accurate as of December 21, 2021  1:30 PM. If you have any questions, ask your nurse or doctor.          aspirin-acetaminophen-caffeine 250-250-65 MG tablet Commonly known as: EXCEDRIN MIGRAINE Take 1 tablet by mouth every 6 (six) hours as needed for headache.   Azelaic Acid 15 % gel Apply 1 application topically daily.   estradiol 0.025 MG/24HR Commonly known as: VIVELLE-DOT APPLY 1 PATCH ONTO THE SKIN 2 TIMES A WEEK   olopatadine 0.1 % ophthalmic solution Commonly known as: PATANOL Place 1 drop into both eyes 2 (two) times daily as needed for allergies (seasonal).        Allergies: No Known Allergies  Family History: Family History  Problem Relation Age of Onset   Vascular Disease Father    Colon cancer Paternal Aunt    Heart disease Maternal Grandmother    Breast cancer Maternal Aunt 65   Diabetes Neg Hx    Ovarian cancer Neg Hx     Social History:  reports that she has never smoked. She has never used smokeless tobacco. She reports that she does not drink alcohol and does not use drugs.  Physical Exam: BP 130/68   Pulse 68   Ht '5\' 5"'$  (1.651 m)   Wt 113 lb (51.3 kg)   BMI 18.80 kg/m   Constitutional:  Alert, No acute distress. HEENT: [place] AT Respiratory: Normal respiratory effort, no increased work of breathing. Psychiatric: Normal mood and affect.  Laboratory Data:  Urinalysis    Pertinent Imaging: KUB images were personally reviewed and interpreted.  The coarse calcification is stable and consistent with a benign etiology.  The 5 mm left lower pole calculus is not visualized.  The radiologist mentioned a possible right upper pole calculus however this was present on prior KUB and she did not have a right renal calculus on CTU  Abdomen 1 view (KUB)  Narrative CLINICAL DATA:  Kidney  stone.  EXAM: ABDOMEN - 1 VIEW  COMPARISON:  KUB Oct 06, 2020  FINDINGS: The coarse calcification in the right pelvis is stable.  Mild fecal loading in the colon from the mid transverse colon through the rectum.  Both kidneys are partially obscured by bowel contents. Possible 3 mm stone in the upper pole of the right kidney. No other renal stones are noted. No ureteral stones are identified. No other significant abnormalities.  IMPRESSION: 1. Possible 3 or 4 mm stone in the the upper pole of the right kidney. No other renal or ureteral stones noted. 2. Mild fecal loading.   Electronically Signed By: Dorise Bullion III M.D. On: 12/15/2021 17:59   Assessment & Plan:    1.  History gross hematuria UA today 6-10 WBC/3-10 RBC/moderate bacteria Urine culture ordered  2.  Left nephrolithiasis Not visualized on recent KUB Continue annual follow-up with KUB  3.  Pulmonary nodule CTU July 2022 incidentally showed small nodule in the periphery of the right lower lobe, left lateral lobe and posterior right lung base.  She is a non-smoker and per radiology guidelines no follow-up imaging needed.  Will order a PA/lateral chest x-ray as she does not have a primary care physician    [name], [place] [address] [place], [place] [address] [phone]

## 2021-12-22 ENCOUNTER — Encounter: Payer: Self-pay | Admitting: Urology

## 2021-12-25 ENCOUNTER — Telehealth: Payer: Self-pay | Admitting: *Deleted

## 2021-12-25 ENCOUNTER — Encounter: Payer: Self-pay | Admitting: Urology

## 2021-12-25 LAB — CULTURE, URINE COMPREHENSIVE

## 2021-12-25 NOTE — Telephone Encounter (Signed)
Patient states she is not having any symptoms . She would like to know why she always has no symptoms but bacteria . I advised her that it may not be a true clean catch .

## 2021-12-25 NOTE — Telephone Encounter (Signed)
-----   Message from [name], MD sent at 12/25/2021  7:14 AM EDT ----- Urine culture was growing bacteria.  Treatment of bacterial colonization of urine is not recommended without symptoms.  Should she develop UTI symptoms please have her contact the office.

## 2022-01-12 ENCOUNTER — Ambulatory Visit
Admission: RE | Admit: 2022-01-12 | Discharge: 2022-01-12 | Disposition: A | Discharge status: Home / Self Care | Payer: BC Managed Care – PPO | Source: Ambulatory Visit | Attending: Urology | Admitting: Urology

## 2022-01-12 DIAGNOSIS — N200 Calculus of kidney: Secondary | ICD-10-CM | POA: Diagnosis not present

## 2022-01-12 DIAGNOSIS — R911 Solitary pulmonary nodule: Secondary | ICD-10-CM

## 2022-01-16 MED ORDER — ESTRADIOL 0.025 MG/24HR TD PTTW: MEDICATED_PATCH | TRANSDERMAL | 3 refills | Status: DC

## 2022-01-17 ENCOUNTER — Encounter: Payer: Self-pay | Admitting: *Deleted

## 2022-01-18 ENCOUNTER — Other Ambulatory Visit: Payer: Self-pay

## 2022-01-18 MED ORDER — ESTRADIOL 0.025 MG/24HR TD PTTW
MEDICATED_PATCH | TRANSDERMAL | 3 refills | Status: DC
Start: 2022-01-18 — End: 2022-12-13

## 2022-01-23 ENCOUNTER — Telehealth: Payer: Self-pay | Admitting: Obstetrics and Gynecology

## 2022-01-23 NOTE — Telephone Encounter (Signed)
Well unfortunately if she does not want to pick up her prescription from a local pharmacy for 1 or 2 months until her medication is released from [facility], then she unfortunately will have to wait until November as there is nothing I can do to get them to release it sooner.

## 2022-01-23 NOTE — Telephone Encounter (Signed)
Patient called and states that her RX for estradiol is being held by [facility] until November. Patient states they will not release the medication until its time for her refill which is 03/21/2022. Patient states she does not want to pick the medication up locally from [facility]. Please advise

## 2022-02-20 ENCOUNTER — Ambulatory Visit (INDEPENDENT_AMBULATORY_CARE_PROVIDER_SITE_OTHER): Payer: Self-pay

## 2022-02-20 DIAGNOSIS — Z23 Encounter for immunization: Secondary | ICD-10-CM

## 2022-03-19 ENCOUNTER — Other Ambulatory Visit: Payer: Self-pay | Admitting: Obstetrics and Gynecology

## 2022-03-19 DIAGNOSIS — Z1231 Encounter for screening mammogram for malignant neoplasm of breast: Secondary | ICD-10-CM

## 2022-05-23 ENCOUNTER — Ambulatory Visit
Admission: RE | Admit: 2022-05-23 | Discharge: 2022-05-23 | Disposition: A | Discharge status: Home / Self Care | Payer: BC Managed Care – PPO | Source: Ambulatory Visit | Attending: Obstetrics and Gynecology | Admitting: Obstetrics and Gynecology

## 2022-05-23 DIAGNOSIS — Z1231 Encounter for screening mammogram for malignant neoplasm of breast: Secondary | ICD-10-CM | POA: Diagnosis not present

## 2022-05-28 DIAGNOSIS — Z85828 Personal history of other malignant neoplasm of skin: Secondary | ICD-10-CM | POA: Diagnosis not present

## 2022-05-28 DIAGNOSIS — D225 Melanocytic nevi of trunk: Secondary | ICD-10-CM | POA: Diagnosis not present

## 2022-05-28 DIAGNOSIS — D2261 Melanocytic nevi of right upper limb, including shoulder: Secondary | ICD-10-CM | POA: Diagnosis not present

## 2022-05-28 DIAGNOSIS — D2262 Melanocytic nevi of left upper limb, including shoulder: Secondary | ICD-10-CM | POA: Diagnosis not present

## 2022-09-18 ENCOUNTER — Encounter: Payer: Self-pay | Admitting: Obstetrics and Gynecology

## 2022-09-28 ENCOUNTER — Telehealth: Payer: Self-pay | Admitting: Obstetrics and Gynecology

## 2022-09-28 NOTE — Telephone Encounter (Signed)
Patient called 09/28/22 at 9:25, stating that she wanted to do labs prior to her appointment on 10/17/2022.  She wants to do them at her job(at [employer])because they can get labs work done there at a cheaper rate.  Will you please drop the needed labs or write a script for them so she can pick up.  Please advise

## 2022-09-29 ENCOUNTER — Other Ambulatory Visit: Payer: Self-pay | Admitting: Obstetrics and Gynecology

## 2022-09-29 DIAGNOSIS — R7303 Prediabetes: Secondary | ICD-10-CM

## 2022-09-29 DIAGNOSIS — Z01419 Encounter for gynecological examination (general) (routine) without abnormal findings: Secondary | ICD-10-CM

## 2022-09-29 DIAGNOSIS — E038 Other specified hypothyroidism: Secondary | ICD-10-CM

## 2022-09-29 DIAGNOSIS — M85859 Other specified disorders of bone density and structure, unspecified thigh: Secondary | ICD-10-CM

## 2022-09-29 NOTE — Telephone Encounter (Signed)
Please inform lab orders have been placed.

## 2022-10-03 ENCOUNTER — Other Ambulatory Visit: Payer: BC Managed Care – PPO

## 2022-10-03 DIAGNOSIS — M85859 Other specified disorders of bone density and structure, unspecified thigh: Secondary | ICD-10-CM

## 2022-10-03 DIAGNOSIS — R7303 Prediabetes: Secondary | ICD-10-CM

## 2022-10-03 DIAGNOSIS — Z01419 Encounter for gynecological examination (general) (routine) without abnormal findings: Secondary | ICD-10-CM

## 2022-10-03 DIAGNOSIS — E038 Other specified hypothyroidism: Secondary | ICD-10-CM

## 2022-10-04 LAB — COMPREHENSIVE METABOLIC PANEL

## 2022-10-04 LAB — VITAMIN D 25 HYDROXY (VIT D DEFICIENCY, FRACTURES): Vit D, 25-Hydroxy: 24.9 ng/mL — ABNORMAL LOW (ref 30.0–100.0)

## 2022-10-04 LAB — CBC: RDW: 12.1 % (ref 11.7–15.4)

## 2022-10-05 LAB — COMPREHENSIVE METABOLIC PANEL
AST: 13 IU/L (ref 0–40)
Albumin/Globulin Ratio: 2.8 — ABNORMAL HIGH (ref 1.2–2.2)
BUN/Creatinine Ratio: 15 (ref 12–28)
BUN: 13 mg/dL (ref 8–27)
Chloride: 101 mmol/L (ref 96–106)
Creatinine, Ser: 0.85 mg/dL (ref 0.57–1.00)
Globulin, Total: 1.6 g/dL (ref 1.5–4.5)
Glucose: 66 mg/dL — ABNORMAL LOW (ref 70–99)
Potassium: 4.5 mmol/L (ref 3.5–5.2)
Sodium: 140 mmol/L (ref 134–144)
Total Protein: 6 g/dL (ref 6.0–8.5)
eGFR: 76 mL/min/{1.73_m2} (ref >59)

## 2022-10-05 LAB — CBC
Hematocrit: 36.9 % (ref 34.0–46.6)
Hemoglobin: 12.3 g/dL (ref 11.1–15.9)
MCH: 31.9 pg (ref 26.6–33.0)
MCHC: 33.3 g/dL (ref 31.5–35.7)
MCV: 96 fL (ref 79–97)
Platelets: 184 10*3/uL (ref 150–450)
RBC: 3.85 x10E6/uL (ref 3.77–5.28)
WBC: 5 10*3/uL (ref 3.4–10.8)

## 2022-10-05 LAB — HEMOGLOBIN A1C
Est. average glucose Bld gHb Est-mCnc: 123 mg/dL
Hgb A1c MFr Bld: 5.9 % — ABNORMAL HIGH (ref 4.8–5.6)

## 2022-10-05 LAB — TSH: TSH: 5.51 u[IU]/mL — ABNORMAL HIGH (ref 0.450–4.500)

## 2022-10-16 NOTE — Patient Instructions (Signed)
Breast Self-Awareness Breast self-awareness is knowing how your breasts look and feel. You need to: Check your breasts on a regular basis. Tell your doctor about any changes. Become familiar with the look and feel of your breasts. This can help you catch a breast problem while it is still small and can be treated. You should do breast self-exams even if you have breast implants. What you need: A mirror. A well-lit room. A pillow or other soft object. How to do a breast self-exam Follow these steps to do a breast self-exam: Look for changes  Take off all the clothes above your waist. Stand in front of a mirror in a room with good lighting. Put your hands down at your sides. Compare your breasts in the mirror. Look for any difference between them, such as: A difference in shape. A difference in size. Wrinkles, dips, and bumps in one breast and not the other. Look at each breast for changes in the skin, such as: Redness. Scaly areas. Skin that has gotten thicker. Dimpling. Open sores (ulcers). Look for changes in your nipples, such as: Fluid coming out of a nipple. Fluid around a nipple. Bleeding. Dimpling. Redness. A nipple that looks pushed in (retracted), or that has changed position. Feel for changes Lie on your back. Feel each breast. To do this: Pick a breast to feel. Place a pillow under the shoulder closest to that breast. Put the arm closest to that breast behind your head. Feel the nipple area of that breast using the hand of your other arm. Feel the area with the pads of your three middle fingers by making small circles with your fingers. Use light, medium, and firm pressure. Continue the overlapping circles, moving downward over the breast. Keep making circles with your fingers. Stop when you feel your ribs. Start making circles with your fingers again, this time going upward until you reach your collarbone. Then, make circles outward across your breast and into your  armpit area. Squeeze your nipple. Check for discharge and lumps. Repeat these steps to check your other breast. Sit or stand in the tub or shower. With soapy water on your skin, feel each breast the same way you did when you were lying down. Write down what you find Writing down what you find can help you remember what to tell your doctor. Write down: What is normal for each breast. Any changes you find in each breast. These include: The kind of changes you find. A tender or painful breast. Any lump you find. Write down its size and where it is. When you last had your monthly period (menstrual cycle). General tips If you are breastfeeding, the best time to check your breasts is after you feed your baby or after you use a breast pump. If you get monthly bleeding, the best time to check your breasts is 5-7 days after your monthly cycle ends. With time, you will become comfortable with the self-exam. You will also start to know if there are changes in your breasts. Contact a doctor if: You see a change in the shape or size of your breasts or nipples. You see a change in the skin of your breast or nipples, such as red or scaly skin. You have fluid coming from your nipples that is not normal. You find a new lump or thick area. You have breast pain. You have any concerns about your breast health. Summary Breast self-awareness includes looking for changes in your breasts and feeling for changes  within your breasts. You should do breast self-awareness in front of a mirror in a well-lit room. If you get monthly periods (menstrual cycles), the best time to check your breasts is 5-7 days after your period ends. Tell your doctor about any changes you see in your breasts. Changes include changes in size, changes on the skin, painful or tender breasts, or fluid from your nipples that is not normal. This information is not intended to replace advice given to you by your health care provider. Make sure  you discuss any questions you have with your health care provider. Document Revised: 10/12/2021 Document Reviewed: 03/09/2021 Elsevier Patient Education  2024 [employer]. Preventive Care 65 Years and Older, Female Preventive care refers to lifestyle choices and visits with your health care provider that can promote health and wellness. Preventive care visits are also called wellness exams. What can I expect for my preventive care visit? Counseling Your health care provider may ask you questions about your: Medical history, including: Past medical problems. Family medical history. Pregnancy and menstrual history. History of falls. Current health, including: Memory and ability to understand (cognition). Emotional well-being. Home life and relationship well-being. Sexual activity and sexual health. Lifestyle, including: Alcohol, nicotine or tobacco, and drug use. Access to firearms. Diet, exercise, and sleep habits. Work and work Astronomer. Sunscreen use. Safety issues such as seatbelt and bike helmet use. Physical exam Your health care provider will check your: Height and weight. These may be used to calculate your BMI (body mass index). BMI is a measurement that tells if you are at a healthy weight. Waist circumference. This measures the distance around your waistline. This measurement also tells if you are at a healthy weight and may help predict your risk of certain diseases, such as type 2 diabetes and high blood pressure. Heart rate and blood pressure. Body temperature. Skin for abnormal spots. What immunizations do I need?  Vaccines are usually given at various ages, according to a schedule. Your health care provider will recommend vaccines for you based on your age, medical history, and lifestyle or other factors, such as travel or where you work. What tests do I need? Screening Your health care provider may recommend screening tests for certain conditions. This may  include: Lipid and cholesterol levels. Hepatitis C test. Hepatitis B test. HIV (human immunodeficiency virus) test. STI (sexually transmitted infection) testing, if you are at risk. Lung cancer screening. Colorectal cancer screening. Diabetes screening. This is done by checking your blood sugar (glucose) after you have not eaten for a while (fasting). Mammogram. Talk with your health care provider about how often you should have regular mammograms. BRCA-related cancer screening. This may be done if you have a family history of breast, ovarian, tubal, or peritoneal cancers. Bone density scan. This is done to screen for osteoporosis. Talk with your health care provider about your test results, treatment options, and if necessary, the need for more tests. Follow these instructions at home: Eating and drinking  Eat a diet that includes fresh fruits and vegetables, whole grains, lean protein, and low-fat dairy products. Limit your intake of foods with high amounts of sugar, saturated fats, and salt. Take vitamin and mineral supplements as recommended by your health care provider. Do not drink alcohol if your health care provider tells you not to drink. If you drink alcohol: Limit how much you have to 0-1 drink a day. Know how much alcohol is in your drink. In the [place] one drink equals one 12 oz  bottle of beer (355 mL), one 5 oz glass of wine (148 mL), or one 1 oz glass of hard liquor (44 mL). Lifestyle Brush your teeth every morning and night with fluoride toothpaste. Floss one time each day. Exercise for at least 30 minutes 5 or more days each week. Do not use any products that contain nicotine or tobacco. These products include cigarettes, chewing tobacco, and vaping devices, such as e-cigarettes. If you need help quitting, ask your health care provider. Do not use drugs. If you are sexually active, practice safe sex. Use a condom or other form of protection in order to prevent STIs. Take  aspirin only as told by your health care provider. Make sure that you understand how much to take and what form to take. Work with your health care provider to find out whether it is safe and beneficial for you to take aspirin daily. Ask your health care provider if you need to take a cholesterol-lowering medicine (statin). Find healthy ways to manage stress, such as: Meditation, yoga, or listening to music. Journaling. Talking to a trusted person. Spending time with friends and family. Minimize exposure to UV radiation to reduce your risk of skin cancer. Safety Always wear your seat belt while driving or riding in a vehicle. Do not drive: If you have been drinking alcohol. Do not ride with someone who has been drinking. When you are tired or distracted. While texting. If you have been using any mind-altering substances or drugs. Wear a helmet and other protective equipment during sports activities. If you have firearms in your house, make sure you follow all gun safety procedures. What's next? Visit your health care provider once a year for an annual wellness visit. Ask your health care provider how often you should have your eyes and teeth checked. Stay up to date on all vaccines. This information is not intended to replace advice given to you by your health care provider. Make sure you discuss any questions you have with your health care provider. Document Revised: 11/02/2020 Document Reviewed: 11/02/2020 Elsevier Patient Education  2024 [employer].

## 2022-10-16 NOTE — Progress Notes (Unsigned)
ANNUAL PREVENTATIVE CARE GYNECOLOGY  ENCOUNTER NOTE  Subjective:       Sharon Le is a 67 y.o. G0P0000 female here for a routine annual gynecologic exam. The patient is not sexually active. The patient is taking hormone replacement therapy. Patient denies post-menopausal vaginal bleeding. The patient wears seatbelts: {yes/no:311178}. The patient participates in regular exercise: {yes/no/not asked:9010}. Has the patient ever been transfused or tattooed?: {yes/no/not asked:9010}. The patient reports that there {is/is not:9024} domestic violence in her life.  Current complaints: 1.  ***    Gynecologic History No LMP recorded. Patient has had a hysterectomy. Contraception: status post hysterectomy Last Pap: 07/07/2020. Results were: normal Last mammogram: 05/23/2022. Results were: normal Last Colonoscopy: 10/05/2015 Last Dexa Scan: overdue   Obstetric History OB History  Gravida Para Term Preterm AB Living  0 0 0 0 0 0  SAB IAB Ectopic Multiple Live Births  0 0 0 0    Obstetric Comments  1st Menstrual Cycle:  16       Past Medical History:  Diagnosis Date   Abnormal TSH    Adenomatous colon polyp    Anemia    Basal cell carcinoma    Complication of anesthesia    HARD TIME WAKING UP AFTER HYSTERECTOMY   Decreased fibrinogen (HCC)    Elevated TSH    Family history of adverse reaction to anesthesia    PT UNSURE   Fatigue    History of kidney stones    Intraductal papilloma of breast, left 2019   MVP (mitral valve prolapse)    DX AT AGE 46-PT ASYMPTOMATIC AND HAS NEVER SEEN CARDIOLOGIST OR HAD ECHO DONE   Osteopenia    Retinal tear    Rosacea    Vasomotor instability    Yeast infection     Family History  Problem Relation Age of Onset   Vascular Disease Father    Colon cancer Paternal Aunt    Heart disease Maternal Grandmother    Breast cancer Maternal Aunt 20   Diabetes Neg Hx    Ovarian cancer Neg Hx     Past Surgical History:  Procedure Laterality  Date   BASAL CELL CARCINOMA EXCISION  abd   BREAST DUCTAL SYSTEM EXCISION Left 06/07/2017   Procedure: EXCISION DUCTAL SYSTEM BREAST;  Surgeon: Earline Mayotte, MD;  Location: [facility] ORS;  Service: General;  Laterality: Left;   BREAST EXCISIONAL BIOPSY Left 06/07/2017   SCLEROSING INTRADUCTAL PAPILLOMA, 4 MM   COLONOSCOPY WITH PROPOFOL N/A 10/05/2015   Procedure: COLONOSCOPY WITH PROPOFOL;  Surgeon: Scot Jun, MD;  Location: [facility] ENDOSCOPY;  Service: Endoscopy;  Laterality: N/A;   VAGINAL HYSTERECTOMY  2010   [facility]   WISDOM TOOTH EXTRACTION      Social History   Socioeconomic History   Marital status: Single    Spouse name: Not on file   Number of children: Not on file   Years of education: Not on file   Highest education level: Not on file  Occupational History   Not on file  Tobacco Use   Smoking status: Never   Smokeless tobacco: Never  Vaping Use   Vaping Use: Never used  Substance and Sexual Activity   Alcohol use: No   Drug use: No   Sexual activity: Not Currently  Other Topics Concern   Not on file  Social History Narrative   Not on file   Social Determinants of Health   Financial Resource Strain: Not on file  Food Insecurity: Not  on file  Transportation Needs: Not on file  Physical Activity: Inactive (07/17/2017)   Exercise Vital Sign    Days of Exercise per Week: 0 days    Minutes of Exercise per Session: 0 min  Stress: Not on file  Social Connections: Not on file  Intimate Partner Violence: Not on file    Current Outpatient Medications on File Prior to Visit  Medication Sig Dispense Refill   aspirin-acetaminophen-caffeine (EXCEDRIN MIGRAINE) 250-250-65 MG tablet Take 1 tablet by mouth every 6 (six) hours as needed for headache.     Azelaic Acid 15 % cream Apply 1 application topically daily.      estradiol (VIVELLE-DOT) 0.025 MG/24HR APPLY 1 PATCH ONTO THE SKIN 2 TIMES A WEEK 24 patch 3   olopatadine (PATANOL) 0.1 % ophthalmic solution Place 1 drop  into both eyes 2 (two) times daily as needed for allergies (seasonal).      No current facility-administered medications on file prior to visit.    No Known Allergies    Review of Systems ROS Review of Systems - General ROS: negative for - chills, fatigue, fever, hot flashes, night sweats, weight gain or weight loss Psychological ROS: negative for - anxiety, decreased libido, depression, mood swings, physical abuse or sexual abuse Ophthalmic ROS: negative for - blurry vision, eye pain or loss of vision ENT ROS: negative for - headaches, hearing change, visual changes or vocal changes Allergy and Immunology ROS: negative for - hives, itchy/watery eyes or seasonal allergies Hematological and Lymphatic ROS: negative for - bleeding problems, bruising, swollen lymph nodes or weight loss Endocrine ROS: negative for - galactorrhea, hair pattern changes, hot flashes, malaise/lethargy, mood swings, palpitations, polydipsia/polyuria, skin changes, temperature intolerance or unexpected weight changes Breast ROS: negative for - new or changing breast lumps or nipple discharge Respiratory ROS: negative for - cough or shortness of breath Cardiovascular ROS: negative for - chest pain, irregular heartbeat, palpitations or shortness of breath Gastrointestinal ROS: no abdominal pain, change in bowel habits, or black or bloody stools Genito-Urinary ROS: no dysuria, trouble voiding, or hematuria Musculoskeletal ROS: negative for - joint pain or joint stiffness Neurological ROS: negative for - bowel and bladder control changes Dermatological ROS: negative for rash and skin lesion changes   Objective:   There were no vitals taken for this visit. CONSTITUTIONAL: Well-developed, well-nourished female in no acute distress.  PSYCHIATRIC: Normal mood and affect. Normal behavior. Normal judgment and thought content. NEUROLGIC: Alert and oriented to person, place, and time. Normal muscle tone coordination. No  cranial nerve deficit noted. HENT:  Normocephalic, atraumatic, External right and left ear normal. Oropharynx is clear and moist EYES: Conjunctivae and EOM are normal. Pupils are equal, round, and reactive to light. No scleral icterus.  NECK: Normal range of motion, supple, no masses.  Normal thyroid.  SKIN: Skin is warm and dry. No rash noted. Not diaphoretic. No erythema. No pallor. CARDIOVASCULAR: Normal heart rate noted, regular rhythm, no murmur. RESPIRATORY: Clear to auscultation bilaterally. Effort and breath sounds normal, no problems with respiration noted. BREASTS: Symmetric in size. No masses, skin changes, nipple drainage, or lymphadenopathy. ABDOMEN: Soft, normal bowel sounds, no distention noted.  No tenderness, rebound or guarding.  BLADDER: Normal PELVIC:  Bladder {:311640}  Urethra: {:311719}  Vulva: {:311722}  Vagina: {:311643}  Cervix: {:311644}  Uterus: {:311718}  Adnexa: {:311645}  RV: {Blank multiple:19196::"External Exam NormaI","No Rectal Masses","Normal Sphincter tone"}  MUSCULOSKELETAL: Normal range of motion. No tenderness.  No cyanosis, clubbing, or edema.  2+ distal pulses.  LYMPHATIC: No Axillary, Supraclavicular, or Inguinal Adenopathy.   Labs: Lab Results  Component Value Date   WBC 5.0 10/03/2022   HGB 12.3 10/03/2022   HCT 36.9 10/03/2022   MCV 96 10/03/2022   PLT 184 10/03/2022    Lab Results  Component Value Date   CREATININE 0.85 10/03/2022   BUN 13 10/03/2022   NA 140 10/03/2022   K 4.5 10/03/2022   CL 101 10/03/2022   CO2 23 10/03/2022    Lab Results  Component Value Date   ALT 9 10/03/2022   AST 13 10/03/2022   ALKPHOS 65 10/03/2022   BILITOT 0.7 10/03/2022    Lab Results  Component Value Date   CHOL 181 07/13/2021   HDL 67 07/13/2021   LDLCALC 103 (H) 07/13/2021   TRIG 54 07/13/2021   CHOLHDL 2.7 07/13/2021    Lab Results  Component Value Date   TSH 5.510 (H) 10/03/2022    Lab Results  Component Value Date    HGBA1C 5.9 (H) 10/03/2022     Assessment:   1. Well woman exam with routine gynecological exam      Plan:  Pap:  UTD Mammogram:  UTD Colon Screening:   UTD Labs:  UTD on 10/03/2022 Routine preventative health maintenance measures emphasized:  Self Breast Exam and Exercise/Diet/Weight control COVID Vaccination status: Return to Clinic - 1 Year   Hildred Laser, MD [facility]

## 2022-10-17 ENCOUNTER — Ambulatory Visit (INDEPENDENT_AMBULATORY_CARE_PROVIDER_SITE_OTHER): Payer: BC Managed Care – PPO | Admitting: Obstetrics and Gynecology

## 2022-10-17 ENCOUNTER — Encounter: Payer: Self-pay | Admitting: Obstetrics and Gynecology

## 2022-10-17 VITALS — BP 120/77 | HR 66 | Resp 16 | Ht 65.0 in | Wt 117.0 lb

## 2022-10-17 DIAGNOSIS — R7303 Prediabetes: Secondary | ICD-10-CM

## 2022-10-17 DIAGNOSIS — E038 Other specified hypothyroidism: Secondary | ICD-10-CM

## 2022-10-17 DIAGNOSIS — M85859 Other specified disorders of bone density and structure, unspecified thigh: Secondary | ICD-10-CM

## 2022-10-17 DIAGNOSIS — E559 Vitamin D deficiency, unspecified: Secondary | ICD-10-CM

## 2022-10-17 DIAGNOSIS — N951 Menopausal and female climacteric states: Secondary | ICD-10-CM

## 2022-10-17 DIAGNOSIS — Z13220 Encounter for screening for lipoid disorders: Secondary | ICD-10-CM

## 2022-10-17 DIAGNOSIS — Z1211 Encounter for screening for malignant neoplasm of colon: Secondary | ICD-10-CM

## 2022-10-17 DIAGNOSIS — Z01419 Encounter for gynecological examination (general) (routine) without abnormal findings: Secondary | ICD-10-CM

## 2022-10-17 DIAGNOSIS — Z13820 Encounter for screening for osteoporosis: Secondary | ICD-10-CM

## 2022-10-17 DIAGNOSIS — N393 Stress incontinence (female) (male): Secondary | ICD-10-CM

## 2022-10-18 LAB — LIPID PANEL
Chol/HDL Ratio: 2.6 ratio (ref 0.0–4.4)
Cholesterol, Total: 179 mg/dL (ref 100–199)
HDL: 69 mg/dL (ref >39)
LDL Chol Calc (NIH): 99 mg/dL (ref 0–99)
Triglycerides: 57 mg/dL (ref 0–149)
VLDL Cholesterol Cal: 11 mg/dL (ref 5–40)

## 2022-12-12 ENCOUNTER — Other Ambulatory Visit: Payer: Self-pay

## 2022-12-12 DIAGNOSIS — N951 Menopausal and female climacteric states: Secondary | ICD-10-CM

## 2022-12-18 ENCOUNTER — Ambulatory Visit
Admission: RE | Admit: 2022-12-18 | Discharge: 2022-12-18 | Disposition: A | Discharge status: Home / Self Care | Payer: BC Managed Care – PPO | Source: Ambulatory Visit | Attending: Urology | Admitting: Urology

## 2022-12-18 DIAGNOSIS — N200 Calculus of kidney: Secondary | ICD-10-CM | POA: Diagnosis not present

## 2022-12-24 ENCOUNTER — Ambulatory Visit: Payer: BC Managed Care – PPO | Admitting: Urology

## 2022-12-24 ENCOUNTER — Encounter: Payer: Self-pay | Admitting: Urology

## 2022-12-24 VITALS — BP 121/70 | HR 60 | Ht 65.0 in | Wt 120.0 lb

## 2022-12-24 DIAGNOSIS — N393 Stress incontinence (female) (male): Secondary | ICD-10-CM | POA: Diagnosis not present

## 2022-12-24 DIAGNOSIS — Z87442 Personal history of urinary calculi: Secondary | ICD-10-CM | POA: Diagnosis not present

## 2022-12-24 DIAGNOSIS — N200 Calculus of kidney: Secondary | ICD-10-CM

## 2022-12-24 NOTE — Progress Notes (Signed)
I,Dina M Abdulla,acting as a scribe for Riki Altes, MD.,have documented all relevant documentation on the behalf of Riki Altes, MD,as directed by  Riki Altes, MD while in the presence of Riki Altes, MD.  12/24/2022 3:17 PM   Sharon Le 14-Aug-1955 [id number]  Referring provider: Danella Penton, MD [id number] [facility] [address] [facility] [place],  [place] [address]  Chief Complaint  Patient presents with   Nephrolithiasis   Urologic history: 1.  Gross hematuria Evaluated 10/2020 CTU 5 mm left lower pole calculus Cystoscopy no abnormalities  2.  Stress urinary incontinence   HPI: 67 y.o. female presents for annual follow-up.  Doing well since last visit No bothersome LUTS Denies dysuria, gross hematuria Denies flank, abdominal or pelvic pain    PMH: Past Medical History:  Diagnosis Date   Abnormal TSH    Adenomatous colon polyp    Anemia    Basal cell carcinoma    Complication of anesthesia    HARD TIME WAKING UP AFTER HYSTERECTOMY   Decreased fibrinogen (HCC)    Elevated TSH    Family history of adverse reaction to anesthesia    PT UNSURE   Fatigue    History of kidney stones    Intraductal papilloma of breast, left 2019   MVP (mitral valve prolapse)    DX AT AGE 61-PT ASYMPTOMATIC AND HAS NEVER SEEN CARDIOLOGIST OR HAD ECHO DONE   Osteopenia    Retinal tear    Rosacea    Vasomotor instability    Yeast infection     Surgical History: Past Surgical History:  Procedure Laterality Date   BASAL CELL CARCINOMA EXCISION  abd   BREAST DUCTAL SYSTEM EXCISION Left 06/07/2017   Procedure: EXCISION DUCTAL SYSTEM BREAST;  Surgeon: Earline Mayotte, MD;  Location: [facility] ORS;  Service: General;  Laterality: Left;   BREAST EXCISIONAL BIOPSY Left 06/07/2017   SCLEROSING INTRADUCTAL PAPILLOMA, 4 MM   COLONOSCOPY WITH PROPOFOL N/A 10/05/2015   Procedure: COLONOSCOPY WITH PROPOFOL;  Surgeon: Scot Jun, MD;  Location: [facility]  ENDOSCOPY;  Service: Endoscopy;  Laterality: N/A;   VAGINAL HYSTERECTOMY  2010   [facility]   WISDOM TOOTH EXTRACTION      Home Medications:  Allergies as of 12/24/2022   No Known Allergies      Medication List        Accurate as of December 24, 2022  3:17 PM. If you have any questions, ask your nurse or doctor.          aspirin-acetaminophen-caffeine 250-250-65 MG tablet Commonly known as: EXCEDRIN MIGRAINE Take 1 tablet by mouth every 6 (six) hours as needed for headache.   Azelaic Acid 15 % gel Apply 1 application topically daily.   estradiol 0.025 MG/24HR Commonly known as: VIVELLE-DOT APPLY 1 PATCH ON THE SKIN TWICE A WEEK   fluticasone 50 MCG/ACT nasal spray Commonly known as: FLONASE Place 1 spray into both nostrils daily.   olopatadine 0.1 % ophthalmic solution Commonly known as: PATANOL Place 1 drop into both eyes 2 (two) times daily as needed for allergies (seasonal).         Family History: Family History  Problem Relation Age of Onset   Vascular Disease Father    Colon cancer Paternal Aunt    Heart disease Maternal Grandmother    Breast cancer Maternal Aunt 27   Diabetes Neg Hx    Ovarian cancer Neg Hx     Social History:  reports that  she has never smoked. She has never used smokeless tobacco. She reports that she does not drink alcohol and does not use drugs.   Physical Exam: BP 121/70   Pulse 60   Ht 5\' 5"  (1.651 m)   Wt 120 lb (54.4 kg)   BMI 19.97 kg/m   Constitutional:  Alert, No acute distress. HEENT: [place] AT Respiratory: Normal respiratory effort, no increased work of breathing. Psychiatric: Normal mood and affect.    Pertinent Imaging: KUB performed earlier today was personally reviewed and interpreted. There is a moderate amount of stool and bowel gas overlying the left renal outline, and the left renal calculus is not visualized.    Assessment & Plan:    1.  Left nephrolithiasis Not visualized today due to overlying stool and  bowel gas. Will schedule one year follow up with renal ultrasound.  2.  Stress urinary incontinence She has not had time in her schedule to focus on pelvic floor physical therapy, however, she is retiring at the end of this year. If her incontinence is still a problem, will call back for a PT referral.  I have reviewed the above documentation for accuracy and completeness, and I agree with the above.   Riki Altes, MD  [facility] [address], [address] [place], [place] [address] [phone]

## 2023-03-18 ENCOUNTER — Ambulatory Visit (INDEPENDENT_AMBULATORY_CARE_PROVIDER_SITE_OTHER): Payer: Self-pay | Admitting: Physician Assistant

## 2023-03-18 ENCOUNTER — Other Ambulatory Visit: Payer: Self-pay

## 2023-03-18 ENCOUNTER — Encounter: Payer: Self-pay | Admitting: Physician Assistant

## 2023-03-18 VITALS — BP 120/78 | HR 66 | Temp 96.1°F | Ht 65.0 in | Wt 118.0 lb

## 2023-03-18 DIAGNOSIS — S39012A Strain of muscle, fascia and tendon of lower back, initial encounter: Secondary | ICD-10-CM

## 2023-03-18 MED ORDER — NAPROXEN 500 MG PO TABS
500.0000 mg | ORAL_TABLET | Freq: Two times a day (BID) | ORAL | 0 refills | Status: DC
Start: 2023-03-18 — End: 2023-06-20

## 2023-03-18 MED ORDER — CYCLOBENZAPRINE HCL 10 MG PO TABS
10.0000 mg | ORAL_TABLET | Freq: Every day | ORAL | 0 refills | Status: DC
Start: 2023-03-18 — End: 2023-06-20

## 2023-03-18 NOTE — Progress Notes (Signed)
[facility] [address] [place], [place] [address]   Office Visit Note  Patient Name: Sharon Le Date of Birth [id number]  Medical Record number [id number]  Date of Service: 03/18/2023  Chief Complaint  Patient presents with   Acute Visit    Patient c/o back pain x 2 days. No known injury but she does report being very active lately. She has been mowing grass and helping to clean her mother's house. She has been applying ice and taking Tylenol which has been helpful.     67 y/o F presents to the clinic for c/o bilateral low back pain x 2-3 days. Denies direct trauma or fall. No recent MVA. Has on occasion low back pain. Slight pain radiating down leg, but stops near lower hamstrings and her calf feels tight. Denies urinary or bowel incontinence.       Current Medication:  Outpatient Encounter Medications as of 03/18/2023  Medication Sig   aspirin-acetaminophen-caffeine (EXCEDRIN MIGRAINE) 250-250-65 MG tablet Take 1 tablet by mouth every 6 (six) hours as needed for headache.   Azelaic Acid 15 % cream Apply 1 application topically daily.    cyclobenzaprine (FLEXERIL) 10 MG tablet Take 1 tablet (10 mg total) by mouth at bedtime.   estradiol (VIVELLE-DOT) 0.025 MG/24HR APPLY 1 PATCH ON THE SKIN TWICE A WEEK   fluticasone (FLONASE) 50 MCG/ACT nasal spray Place 1 spray into both nostrils daily as needed.   naproxen (NAPROSYN) 500 MG tablet Take 1 tablet (500 mg total) by mouth 2 (two) times daily with a meal.   olopatadine (PATANOL) 0.1 % ophthalmic solution Place 1 drop into both eyes 2 (two) times daily as needed for allergies (seasonal).    No facility-administered encounter medications on file as of 03/18/2023.      Medical History: Past Medical History:  Diagnosis Date   Abnormal TSH    Adenomatous colon polyp    Anemia    Basal cell carcinoma    Complication of anesthesia    HARD TIME WAKING UP AFTER HYSTERECTOMY   Decreased fibrinogen (HCC)    Elevated  TSH    Family history of adverse reaction to anesthesia    PT UNSURE   Fatigue    History of kidney stones    Intraductal papilloma of breast, left 2019   MVP (mitral valve prolapse)    DX AT AGE 65-PT ASYMPTOMATIC AND HAS NEVER SEEN CARDIOLOGIST OR HAD ECHO DONE   Osteopenia    Retinal tear    Rosacea    Vasomotor instability    Yeast infection      Vital Signs: BP 120/78   Pulse 66   Temp (!) 96.1 F (35.6 C)   Ht 5\' 5"  (1.651 m)   Wt 118 lb (53.5 kg)   SpO2 96%   BMI 19.64 kg/m    Review of Systems  Constitutional: Negative.   Gastrointestinal: Negative.   Genitourinary: Negative.   Musculoskeletal:  Positive for back pain and myalgias. Negative for gait problem, joint swelling, neck pain and neck stiffness.  Neurological:  Negative for numbness.    Physical Exam Constitutional:      Appearance: Normal appearance.  HENT:     Head: Normocephalic and atraumatic.     Right Ear: External ear normal.     Left Ear: External ear normal.  Eyes:     Extraocular Movements: Extraocular movements intact.  Musculoskeletal:     Comments: + paraspinal lumbar musculature tenderness. Normal leg extension Limited bending.  Skin:    General: Skin is warm and dry.  Neurological:     Mental Status: She is alert and oriented to person, place, and time.  Psychiatric:        Mood and Affect: Mood normal.        Behavior: Behavior normal.       Assessment/Plan:  1. Strain of lumbar region, initial encounter - cyclobenzaprine (FLEXERIL) 10 MG tablet; Take 1 tablet (10 mg total) by mouth at bedtime.  Dispense: 15 tablet; Refill: 0 - naproxen (NAPROSYN) 500 MG tablet; Take 1 tablet (500 mg total) by mouth 2 (two) times daily with a meal.  Dispense: 30 tablet; Refill: 0  Apply ice as instructed Perform ROM exercises and stretches as instructed Alternate moist heat as needed. Take medicines as prescribed Continue to watch for worsening symptoms. She was given back  exercise and stretches handout today. Pt verbalized understanding and in agreement.  RTC prn  General Counseling: raquelle gismondi understanding of the findings of todays visit and agrees with plan of treatment. I have discussed any further diagnostic evaluation that may be needed or ordered today. We also reviewed her medications today. she has been encouraged to call the office with any questions or concerns that should arise related to todays visit.    Time spent:20 Minutes    Gilberto Better, [place] Physician Assistant

## 2023-03-19 ENCOUNTER — Ambulatory Visit: Payer: BC Managed Care – PPO

## 2023-03-19 NOTE — Progress Notes (Signed)
Nutrition: 03/19/2023  CC: I am having more gas and issues with constipation.  I'm wanting to know if there is a test to determine if I really am lactose intolerant.  Assessment; Notes that her health has remained much the same.  Endocrinologist has determined she has slightly lower thyroid and does not need to be treated with medication.  Continues to have issues with intestinal gas, bloating and regular bouts of constipation. Reports that she will retire on May 21, 2023.  She is looking forward to retiring.  She plans to spend this time to focus on having less stress in her life and to take care of my body.  I want to eat better, eat more vegetables and fruits.  I want to cook and explore making items from scratch.  I want to make my own pasta and bread.  I want to have a diet with fewer additives. I think that if I take better care of myself and have less stress, I will have fewer issues with my intestinal tract.  Expresses concern for determining if she is lactose intolerant.  She continues to experience a great deal of gas when eating certain foods.  Asking for a a way to drink real chocolate milk without getting gas.    Recommendation: - Test to determine the presence of lactose intolerance include: the Hydrogen breath test and the blood glucose test (tested past drinking a solution containing lactose).  -Recommended she use the almond milk or soy milk and make her chocolate syrup from scratch using sugar.  This would remove the possibility of the presence of sugar alcohols or artifical sweetener that can cause intestinal gas.  - Avoid sugar alcohols such as sorbitol, mannitol, xylitol, and hydrogenated starches.    -Speak with primary physician or gastroenterologist to get them to order the test for lactose intolerance.  Follow-up: Sharon Le follow-up prior to May 21, 2023 and her retirement.  Sharon Jessaca Philippi, RN, RD, LDN

## 2023-04-03 DIAGNOSIS — H31002 Unspecified chorioretinal scars, left eye: Secondary | ICD-10-CM | POA: Diagnosis not present

## 2023-04-03 DIAGNOSIS — H40003 Preglaucoma, unspecified, bilateral: Secondary | ICD-10-CM | POA: Diagnosis not present

## 2023-04-03 DIAGNOSIS — H43813 Vitreous degeneration, bilateral: Secondary | ICD-10-CM | POA: Diagnosis not present

## 2023-04-03 DIAGNOSIS — Z0100 Encounter for examination of eyes and vision without abnormal findings: Secondary | ICD-10-CM | POA: Diagnosis not present

## 2023-04-03 DIAGNOSIS — H2513 Age-related nuclear cataract, bilateral: Secondary | ICD-10-CM | POA: Diagnosis not present

## 2023-04-09 ENCOUNTER — Other Ambulatory Visit: Payer: Self-pay | Admitting: Obstetrics and Gynecology

## 2023-04-09 DIAGNOSIS — Z1231 Encounter for screening mammogram for malignant neoplasm of breast: Secondary | ICD-10-CM

## 2023-06-03 ENCOUNTER — Ambulatory Visit
Admission: RE | Admit: 2023-06-03 | Discharge: 2023-06-03 | Disposition: A | Discharge status: Home / Self Care | Payer: Medicare Other | Source: Ambulatory Visit | Attending: Obstetrics and Gynecology | Admitting: Obstetrics and Gynecology

## 2023-06-03 DIAGNOSIS — Z1231 Encounter for screening mammogram for malignant neoplasm of breast: Secondary | ICD-10-CM | POA: Insufficient documentation

## 2023-06-07 ENCOUNTER — Encounter: Payer: Self-pay | Admitting: Obstetrics and Gynecology

## 2023-06-20 ENCOUNTER — Telehealth: Payer: Self-pay

## 2023-06-20 ENCOUNTER — Encounter: Payer: Self-pay | Admitting: Physician Assistant

## 2023-06-20 ENCOUNTER — Ambulatory Visit: Payer: Medicare Other | Admitting: Physician Assistant

## 2023-06-20 VITALS — BP 115/72 | HR 86 | Ht 65.0 in | Wt 115.0 lb

## 2023-06-20 DIAGNOSIS — R300 Dysuria: Secondary | ICD-10-CM | POA: Diagnosis not present

## 2023-06-20 LAB — URINALYSIS, COMPLETE
Bilirubin, UA: NEGATIVE
Glucose, UA: NEGATIVE
Ketones, UA: NEGATIVE
Nitrite, UA: NEGATIVE
Protein,UA: NEGATIVE
Specific Gravity, UA: 1.01 (ref 1.005–1.030)
Urobilinogen, Ur: 0.2 mg/dL (ref 0.2–1.0)
pH, UA: 6.5 (ref 5.0–7.5)

## 2023-06-20 LAB — MICROSCOPIC EXAMINATION: WBC, UA: >30 /[HPF] — AB (ref 0–5)

## 2023-06-20 MED ORDER — NITROFURANTOIN MONOHYD MACRO 100 MG PO CAPS: 100.0000 mg | ORAL_CAPSULE | Freq: Two times a day (BID) | ORAL | 0 refills | Status: AC

## 2023-06-20 NOTE — Telephone Encounter (Signed)
Pt called triage line stating she has UTI symptoms, going on since Saturday. Appt made for today at 2:00, pt aware

## 2023-06-20 NOTE — Progress Notes (Signed)
06/20/2023 2:07 PM   Sharon Le 1955-11-09 [id number]  CC: Chief Complaint  Patient presents with   Dysuria   HPI: Sharon Le is a 68 y.o. female with PMH nephrolithiasis, gross hematuria with benign workup in 2022, and stress incontinence who presents today for evaluation of possible UTI.   Today she reports 5 days of dysuria, urgency, and frequency.  She has been pushing fluids and her symptoms have been improving somewhat.  She has not taken any medication to treat her symptoms.  She denies fever, chills, nausea, or vomiting.  She denies frequent UTIs, but admits that she will have episodes of dysuria, urgency, and frequency every few months that resolved with increased p.o. hydration.  In-office UA today positive for trace intact blood and 2+ leukocytes; urine microscopy with >30 WBCs/HPF and moderate bacteria.   PMH: Past Medical History:  Diagnosis Date   Abnormal TSH    Adenomatous colon polyp    Anemia    Basal cell carcinoma    Complication of anesthesia    HARD TIME WAKING UP AFTER HYSTERECTOMY   Decreased fibrinogen (HCC)    Elevated TSH    Family history of adverse reaction to anesthesia    PT UNSURE   Fatigue    History of kidney stones    Intraductal papilloma of breast, left 2019   MVP (mitral valve prolapse)    DX AT AGE 10-PT ASYMPTOMATIC AND HAS NEVER SEEN CARDIOLOGIST OR HAD ECHO DONE   Osteopenia    Retinal tear    Rosacea    Vasomotor instability    Yeast infection     Surgical History: Past Surgical History:  Procedure Laterality Date   BASAL CELL CARCINOMA EXCISION  abd   BREAST DUCTAL SYSTEM EXCISION Left 06/07/2017   Procedure: EXCISION DUCTAL SYSTEM BREAST;  Surgeon: Earline Mayotte, MD;  Location: [facility] ORS;  Service: General;  Laterality: Left;   BREAST EXCISIONAL BIOPSY Left 06/07/2017   SCLEROSING INTRADUCTAL PAPILLOMA, 4 MM   COLONOSCOPY WITH PROPOFOL N/A 10/05/2015   Procedure: COLONOSCOPY WITH PROPOFOL;  Surgeon: Scot Jun, MD;  Location: [facility] ENDOSCOPY;  Service: Endoscopy;  Laterality: N/A;   VAGINAL HYSTERECTOMY  2010   [facility]   WISDOM TOOTH EXTRACTION      Home Medications:  Allergies as of 06/20/2023   No Known Allergies      Medication List        Accurate as of June 20, 2023  2:07 PM. If you have any questions, ask your nurse or doctor.          STOP taking these medications    cyclobenzaprine 10 MG tablet Commonly known as: FLEXERIL Stopped by: Carman Ching   estradiol 0.025 MG/24HR Commonly known as: VIVELLE-DOT Stopped by: Carman Ching   naproxen 500 MG tablet Commonly known as: NAPROSYN Stopped by: Carman Ching       TAKE these medications    aspirin-acetaminophen-caffeine 250-250-65 MG tablet Commonly known as: EXCEDRIN MIGRAINE Take 1 tablet by mouth every 6 (six) hours as needed for headache.   Azelaic Acid 15 % gel Apply 1 application topically daily.   fluticasone 50 MCG/ACT nasal spray Commonly known as: FLONASE Place 1 spray into both nostrils daily as needed.   olopatadine 0.1 % ophthalmic solution Commonly known as: PATANOL Place 1 drop into both eyes 2 (two) times daily as needed for allergies (seasonal).        Allergies:  No Known Allergies  Family History: Family  History  Problem Relation Age of Onset   Vascular Disease Father    Colon cancer Paternal Aunt    Heart disease Maternal Grandmother    Breast cancer Maternal Aunt 40   Diabetes Neg Hx    Ovarian cancer Neg Hx     Social History:   reports that she has never smoked. She has never used smokeless tobacco. She reports that she does not drink alcohol and does not use drugs.  Physical Exam: BP 115/72   Pulse 86   Ht 5\' 5"  (1.651 m)   Wt 115 lb (52.2 kg)   BMI 19.14 kg/m   Constitutional:  Alert and oriented, no acute distress, nontoxic appearing HEENT: , AT Cardiovascular: No clubbing, cyanosis, or edema Respiratory: Normal  respiratory effort, no increased work of breathing Skin: No rashes, bruises or suspicious lesions Neurologic: Grossly intact, no focal deficits, moving all 4 extremities Psychiatric: Normal mood and affect  Laboratory Data: Results for orders placed or performed in visit on 06/20/23  Microscopic Examination   Collection Time: 06/20/23  1:51 PM   Urine  Result Value Ref Range   WBC, UA >30 (A) 0 - 5 /hpf   RBC, Urine 0-2 0 - 2 /hpf   Epithelial Cells (non renal) 0-10 0 - 10 /hpf   Bacteria, UA Moderate (A) None seen/Few  Urinalysis, Complete   Collection Time: 06/20/23  1:51 PM  Result Value Ref Range   Specific Gravity, UA 1.010 1.005 - 1.030   pH, UA 6.5 5.0 - 7.5   Color, UA Yellow Yellow   Appearance Ur Hazy (A) Clear   Leukocytes,UA 2+ (A) Negative   Protein,UA Negative Negative/Trace   Glucose, UA Negative Negative   Ketones, UA Negative Negative   RBC, UA Trace (A) Negative   Bilirubin, UA Negative Negative   Urobilinogen, Ur 0.2 0.2 - 1.0 mg/dL   Nitrite, UA Negative Negative   Microscopic Examination See below:    Assessment & Plan:   1. Dysuria (Primary) UA appears grossly infected, will start empiric Macrobid and send for culture for further evaluation.  May consider topical vaginal estrogen cream for UTI prevention if these become more frequent. - Urinalysis, Complete - nitrofurantoin, macrocrystal-monohydrate, (MACROBID) 100 MG capsule; Take 1 capsule (100 mg total) by mouth 2 (two) times daily for 5 days.  Dispense: 10 capsule; Refill: 0 - CULTURE, URINE COMPREHENSIVE   Return if symptoms worsen or fail to improve.  Carman Ching, PA-C  [facility] [place] [address], [address] [place], [place] [address] [phone]

## 2023-06-24 LAB — CULTURE, URINE COMPREHENSIVE

## 2023-09-19 ENCOUNTER — Telehealth: Payer: Self-pay | Admitting: Internal Medicine

## 2023-09-19 NOTE — Telephone Encounter (Signed)
 Can you schedule pt for a new pt appt.

## 2023-09-19 NOTE — Telephone Encounter (Signed)
 Copied from CRM [id number]. Topic: Appointments - Scheduling Inquiry for Clinic >> Sep 19, 2023  8:11 AM Marlan Silva wrote: Reason for CRM: Patient sent a letter to Dr. Madelon Scheuermann asking if she would take her on as a patient. She wants to confirm the letter was received being that all her information was in the letter. Patient is asking for a call back at [phone], to let her know if she will be able to see her.

## 2023-09-20 NOTE — Telephone Encounter (Signed)
 Lm for patient to call office to schedule a new patient appointment with Dr Madelon Scheuermann. Dr Madelon Scheuermann ok'd on 09/19/2023.

## 2023-11-12 ENCOUNTER — Ambulatory Visit: Admitting: Internal Medicine

## 2023-11-12 ENCOUNTER — Ambulatory Visit (INDEPENDENT_AMBULATORY_CARE_PROVIDER_SITE_OTHER)

## 2023-11-12 ENCOUNTER — Encounter: Payer: Self-pay | Admitting: Internal Medicine

## 2023-11-12 VITALS — BP 126/66 | HR 93 | Ht 65.0 in | Wt 121.0 lb

## 2023-11-12 DIAGNOSIS — M8580 Other specified disorders of bone density and structure, unspecified site: Secondary | ICD-10-CM

## 2023-11-12 DIAGNOSIS — Z780 Asymptomatic menopausal state: Secondary | ICD-10-CM

## 2023-11-12 DIAGNOSIS — G8929 Other chronic pain: Secondary | ICD-10-CM | POA: Diagnosis not present

## 2023-11-12 DIAGNOSIS — N951 Menopausal and female climacteric states: Secondary | ICD-10-CM

## 2023-11-12 DIAGNOSIS — R7301 Impaired fasting glucose: Secondary | ICD-10-CM

## 2023-11-12 DIAGNOSIS — R238 Other skin changes: Secondary | ICD-10-CM | POA: Diagnosis not present

## 2023-11-12 DIAGNOSIS — Z1211 Encounter for screening for malignant neoplasm of colon: Secondary | ICD-10-CM

## 2023-11-12 DIAGNOSIS — E038 Other specified hypothyroidism: Secondary | ICD-10-CM | POA: Diagnosis not present

## 2023-11-12 DIAGNOSIS — M25511 Pain in right shoulder: Secondary | ICD-10-CM

## 2023-11-12 DIAGNOSIS — R5383 Other fatigue: Secondary | ICD-10-CM | POA: Diagnosis not present

## 2023-11-12 DIAGNOSIS — E559 Vitamin D deficiency, unspecified: Secondary | ICD-10-CM

## 2023-11-12 DIAGNOSIS — Z860101 Personal history of adenomatous and serrated colon polyps: Secondary | ICD-10-CM

## 2023-11-12 DIAGNOSIS — Z23 Encounter for immunization: Secondary | ICD-10-CM | POA: Diagnosis not present

## 2023-11-12 MED ORDER — ESTRADIOL 0.025 MG/24HR TD PTTW: 1.0000 | MEDICATED_PATCH | TRANSDERMAL | 12 refills | Status: DC

## 2023-11-12 NOTE — Assessment & Plan Note (Signed)
 Interval for follow up is unclear.  She had a normal colonscopy in 2017, but has a bother with multiple polyps and a paternal aunt with colon CA .  Referring to GI for follow up

## 2023-11-12 NOTE — Assessment & Plan Note (Signed)
 Resummg low dose Vivelle 

## 2023-11-12 NOTE — Patient Instructions (Signed)
 iT HAS BEEN A PLEASURE MEETING YOU!  THE X RAY RESULTS MAY TAKE A FEW DAYS FOR THE OFFICIAL READ  THE LABS WILL RESULT IN 24 HOURS AND I WILL SEND YOU MY THOUGHTS WHEN I HAVE REVIEWED THEM    Your  DEXA scan  been ordered.  You are encouraged (required) to call to schedule your own  appointment at [facility]  , and their phone number is [phone]

## 2023-11-12 NOTE — Progress Notes (Signed)
 Subjective:  Patient ID: Sharon Le, female    DOB: May 06, 1956  Age: 68 y.o. MRN: [id number]  CC: The primary encounter diagnosis was Postmenopausal estrogen deficiency. Diagnoses of Chilblain-like lesions of multiple fingers, Colon cancer screening, Subclinical hypothyroidism, Other fatigue, Vitamin D  deficiency, Impaired fasting glucose, Chronic right shoulder pain, Need for pneumococcal 20-valent conjugate vaccination, Osteopenia, unspecified location, History of adenomatous polyp of colon, and Symptomatic menopausal or female climacteric states were also pertinent to this visit.   HPI Sharon Le presents for  Chief Complaint  Patient presents with   Establish Care   MENOPAUSE HOT FLUSHES,  NOT CONSTIPATED ANY M.USE TO TAKE ESTROGEN PATCH  .025 PATCH TWICE WEEKLY   TREATING SUBCLINICAL HYPOTHYROID DID NOT HELP.  WANTS TO RESUME ESTROGEN.    NEEDS COLONOSCOPY,  OVERDUE.    TUBULAR ADENOMA ON FIRST COLONOSCOPY  BROTHER HAD 7 REMOVED.  AND AUNT DIED OF COLON CA.  PRIMARY CAREGIVER FOR 61 YR OLD MOTHER. LAST COLONOSOPY    REMOTE DX OF MITRAL VALVE PROLAPSE . NO MURMUR  OSTEOPENIA 2011 DEXA  NEPRHOLITHIASIS HISTORY,  SEES STOIOFF   CC:  RIGHT SHOULDER PAIN STARTED AFTER SWIMMING A SLEDGEHAMMER repeatedly during a home renovation.    FLARES UP OCCASIONALLY PAIN IS posteriorly,  SCAPULAR       Outpatient Medications Prior to Visit  Medication Sig Dispense Refill   aspirin-acetaminophen -caffeine (EXCEDRIN MIGRAINE) 250-250-65 MG tablet Take 1 tablet by mouth every 6 (six) hours as needed for headache.     Azelaic Acid 15 % cream Apply 1 application topically daily.      fluticasone (FLONASE) 50 MCG/ACT nasal spray Place 1 spray into both nostrils daily as needed.     olopatadine (PATANOL) 0.1 % ophthalmic solution Place 1 drop into both eyes 2 (two) times daily as needed for allergies (seasonal).      No facility-administered medications prior to visit.    Review of  Systems;  Patient denies headache, fevers, malaise, unintentional weight loss, skin rash, eye pain, sinus congestion and sinus pain, sore throat, dysphagia,  hemoptysis , cough, dyspnea, wheezing, chest pain, palpitations, orthopnea, edema, abdominal pain, nausea, melena, diarrhea, constipation, flank pain, dysuria, hematuria, urinary  Frequency, nocturia, numbness, tingling, seizures,  Focal weakness, Loss of consciousness,  Tremor, insomnia, depression, anxiety, and suicidal ideation.      Objective:  BP 126/66   Pulse 93   Ht 5' 5 (1.651 m)   Wt 121 lb (54.9 kg)   SpO2 98%   BMI 20.14 kg/m   BP Readings from Last 3 Encounters:  11/12/23 126/66  06/20/23 115/72  03/18/23 120/78    Wt Readings from Last 3 Encounters:  11/12/23 121 lb (54.9 kg)  06/20/23 115 lb (52.2 kg)  03/18/23 118 lb (53.5 kg)    Physical Exam Vitals reviewed.  Constitutional:      General: She is not in acute distress.    Appearance: Normal appearance. She is obese. She is not ill-appearing, toxic-appearing or diaphoretic.  HENT:     Head: Normocephalic.   Eyes:     General: No scleral icterus.       Right eye: No discharge.        Left eye: No discharge.     Conjunctiva/sclera: Conjunctivae normal.    Cardiovascular:     Rate and Rhythm: Normal rate and regular rhythm.     Heart sounds: Normal heart sounds.  Pulmonary:     Effort: Pulmonary effort is normal. No respiratory distress.  Breath sounds: Normal breath sounds.   Musculoskeletal:        General: Normal range of motion.     Right shoulder: Tenderness present.     Left shoulder: Normal.       Arms:   Skin:    General: Skin is warm and dry.   Neurological:     General: No focal deficit present.     Mental Status: She is alert and oriented to person, place, and time. Mental status is at baseline.   Psychiatric:        Mood and Affect: Mood normal.        Behavior: Behavior normal.        Thought Content: Thought content  normal.        Judgment: Judgment normal.    Lab Results  Component Value Date   HGBA1C 5.9 (H) 10/03/2022   HGBA1C 5.9 (H) 07/13/2021   HGBA1C 5.8 (H) 06/21/2020    Lab Results  Component Value Date   CREATININE 0.85 10/03/2022   CREATININE 0.73 07/13/2021   CREATININE 0.85 06/21/2020    Lab Results  Component Value Date   WBC 5.0 10/03/2022   HGB 12.3 10/03/2022   HCT 36.9 10/03/2022   PLT 184 10/03/2022   GLUCOSE 66 (L) 10/03/2022   CHOL 179 10/17/2022   TRIG 57 10/17/2022   HDL 69 10/17/2022   LDLCALC 99 10/17/2022   ALT 9 10/03/2022   AST 13 10/03/2022   NA 140 10/03/2022   K 4.5 10/03/2022   CL 101 10/03/2022   CREATININE 0.85 10/03/2022   BUN 13 10/03/2022   CO2 23 10/03/2022   TSH 5.510 (H) 10/03/2022   INR 0.99 06/04/2017   HGBA1C 5.9 (H) 10/03/2022    MM 3D SCREENING MAMMOGRAM BILATERAL BREAST Result Date: 06/04/2023 CLINICAL DATA:  Screening. EXAM: DIGITAL SCREENING BILATERAL MAMMOGRAM WITH TOMOSYNTHESIS AND CAD TECHNIQUE: Bilateral screening digital craniocaudal and mediolateral oblique mammograms were obtained. Bilateral screening digital breast tomosynthesis was performed. The images were evaluated with computer-aided detection. COMPARISON:  Previous exam(s). ACR Breast Density Category c: The breasts are heterogeneously dense, which may obscure small masses. FINDINGS: There are no findings suspicious for malignancy. IMPRESSION: No mammographic evidence of malignancy. A result letter of this screening mammogram will be mailed directly to the patient. RECOMMENDATION: Screening mammogram in one year. (Code:SM-B-01Y) BI-RADS CATEGORY  1: Negative. Electronically Signed   By: Dobrinka  Dimitrova M.D.   On: 06/04/2023 08:48    Assessment & Plan:  .Postmenopausal estrogen deficiency -     DG Bone Density; Future  Chilblain-like lesions of multiple fingers Assessment & Plan: HISTORICALLY OCCURRING ANUALLY,  BUT LAST TIME INVOLVED ONE TOE    Colon cancer  screening -     Ambulatory referral to Gastroenterology  Subclinical hypothyroidism -     Lipid panel -     LDL cholesterol, direct -     TSH  Other fatigue -     Comprehensive metabolic panel with GFR -     CBC with Differential/Platelet  Vitamin D  deficiency -     VITAMIN D  25 Hydroxy (Vit-D Deficiency, Fractures)  Impaired fasting glucose -     Hemoglobin A1c  Chronic right shoulder pain -     DG Shoulder Right; Future  Need for pneumococcal 20-valent conjugate vaccination -     Pneumococcal conjugate vaccine 20-valent  Osteopenia, unspecified location Assessment & Plan: Noted in 2010.  Repeat DEXA meeded    History of adenomatous polyp of colon  Assessment & Plan: Interval for follow up is unclear.  She had a normal colonscopy in 2017, but has a bother with multiple polyps and a paternal aunt with colon CA .  Referring to GI for follow up    Symptomatic menopausal or female climacteric states Assessment & Plan: Resummg low dose Vivelle     Other orders -     Estradiol ; Place 1 patch onto the skin 2 (two) times a week.  Dispense: 8 patch; Refill: 12     I spent 34 minutes on the day of this face to face encounter reviewing patient's  most recent colonoscopy  prior relevant surgical and non surgical procedures, recent  labs and imaging studies, counseling on unerweight ,   reviewing the assessment and plan with patient, and post visit ordering and reviewing of  diagnostics and therapeutics with patient  .   Follow-up: Return in about 1 year (around 11/11/2024).   Verneita LITTIE Kettering, MD

## 2023-11-12 NOTE — Assessment & Plan Note (Signed)
 HISTORICALLY OCCURRING ANUALLY,  BUT LAST TIME INVOLVED ONE TOE

## 2023-11-12 NOTE — Assessment & Plan Note (Signed)
 Noted in 2010.  Repeat DEXA meeded

## 2023-11-13 ENCOUNTER — Ambulatory Visit: Payer: Self-pay | Admitting: Internal Medicine

## 2023-11-13 LAB — CBC WITH DIFFERENTIAL/PLATELET
Basophils Absolute: 0 10*3/uL (ref 0.0–0.1)
Basophils Relative: 0.9 % (ref 0.0–3.0)
Eosinophils Absolute: 0.1 10*3/uL (ref 0.0–0.7)
Eosinophils Relative: 0.9 % (ref 0.0–5.0)
HCT: 38.8 % (ref 36.0–46.0)
Hemoglobin: 12.8 g/dL (ref 12.0–15.0)
Lymphocytes Relative: 26.4 % (ref 12.0–46.0)
Lymphs Abs: 1.5 10*3/uL (ref 0.7–4.0)
MCHC: 33.1 g/dL (ref 30.0–36.0)
MCV: 96.5 fl (ref 78.0–100.0)
Monocytes Absolute: 0.6 10*3/uL (ref 0.1–1.0)
Monocytes Relative: 10.2 % (ref 3.0–12.0)
Neutro Abs: 3.4 10*3/uL (ref 1.4–7.7)
Neutrophils Relative %: 61.6 % (ref 43.0–77.0)
Platelets: 203 10*3/uL (ref 150.0–400.0)
RBC: 4.02 Mil/uL (ref 3.87–5.11)
RDW: 13.4 % (ref 11.5–15.5)
WBC: 5.5 10*3/uL (ref 4.0–10.5)

## 2023-11-13 LAB — COMPREHENSIVE METABOLIC PANEL WITH GFR
ALT: 11 U/L (ref 0–35)
AST: 15 U/L (ref 0–37)
Albumin: 4.5 g/dL (ref 3.5–5.2)
Alkaline Phosphatase: 59 U/L (ref 39–117)
BUN: 16 mg/dL (ref 6–23)
CO2: 32 meq/L (ref 19–32)
Calcium: 9.3 mg/dL (ref 8.4–10.5)
Chloride: 102 meq/L (ref 96–112)
Creatinine, Ser: 0.91 mg/dL (ref 0.40–1.20)
GFR: 65.55 mL/min (ref >60.00)
Glucose, Bld: 90 mg/dL (ref 70–99)
Potassium: 4.2 meq/L (ref 3.5–5.1)
Sodium: 141 meq/L (ref 135–145)
Total Bilirubin: 0.7 mg/dL (ref 0.2–1.2)
Total Protein: 6.4 g/dL (ref 6.0–8.3)

## 2023-11-13 LAB — VITAMIN D 25 HYDROXY (VIT D DEFICIENCY, FRACTURES): VITD: 30.85 ng/mL (ref 30.00–100.00)

## 2023-11-13 LAB — LIPID PANEL
Cholesterol: 211 mg/dL — ABNORMAL HIGH (ref 0–200)
HDL: 72.3 mg/dL (ref >39.00)
LDL Cholesterol: 122 mg/dL — ABNORMAL HIGH (ref 0–99)
NonHDL: 138.68
Total CHOL/HDL Ratio: 3
Triglycerides: 81 mg/dL (ref 0.0–149.0)
VLDL: 16.2 mg/dL (ref 0.0–40.0)

## 2023-11-13 LAB — TSH: TSH: 3.61 u[IU]/mL (ref 0.35–5.50)

## 2023-11-13 LAB — LDL CHOLESTEROL, DIRECT: Direct LDL: 123 mg/dL

## 2023-11-13 LAB — HEMOGLOBIN A1C: Hgb A1c MFr Bld: 6.2 % (ref 4.6–6.5)

## 2023-12-19 ENCOUNTER — Telehealth: Payer: Self-pay

## 2023-12-19 NOTE — Telephone Encounter (Signed)
 Patient called office to follow up on her call from earlier today. She had not read her mychart message, so I let her know that US  is covered and doesn't need a prior authorization. Patient vebalized understanding.

## 2023-12-19 NOTE — Telephone Encounter (Signed)
 Sharon Le called triage line to see if her ultrasound tomorrow is being covered by her insurance and would like a call back to confirm.

## 2023-12-20 ENCOUNTER — Ambulatory Visit
Admission: RE | Admit: 2023-12-20 | Discharge: 2023-12-20 | Disposition: A | Discharge status: Home / Self Care | Payer: BC Managed Care – PPO | Source: Ambulatory Visit | Attending: Urology | Admitting: Urology

## 2023-12-20 DIAGNOSIS — N200 Calculus of kidney: Secondary | ICD-10-CM | POA: Diagnosis present

## 2023-12-24 ENCOUNTER — Telehealth: Payer: Self-pay | Admitting: Internal Medicine

## 2023-12-24 NOTE — Telephone Encounter (Unsigned)
 Copied from CRM [id number]. Topic: General - Billing Inquiry >> Dec 24, 2023 10:52 AM Gennette ORN wrote: Reason for CRM: Patient has a billing question for her upcoming testing and also she wants to make sure she isn't being billed to pay out of pocket for anything.

## 2023-12-25 ENCOUNTER — Ambulatory Visit (INDEPENDENT_AMBULATORY_CARE_PROVIDER_SITE_OTHER): Payer: Self-pay | Admitting: Urology

## 2023-12-25 ENCOUNTER — Encounter: Payer: Self-pay | Admitting: Urology

## 2023-12-25 VITALS — BP 152/71 | HR 70 | Ht 65.0 in | Wt 120.0 lb

## 2023-12-25 DIAGNOSIS — Z87442 Personal history of urinary calculi: Secondary | ICD-10-CM | POA: Diagnosis not present

## 2023-12-25 NOTE — Progress Notes (Signed)
 12/25/2023 1:00 PM   KAINAT PIZANA [birth year] [id number]  Referring provider: Cleotilde Oneil FALCON, MD [phone] [facility] [address] [facility] [place],  [place] [address]  Chief Complaint  Patient presents with   Follow-up   Urologic history: 1.  Gross hematuria Evaluated 10/2020 CTU 5 mm left lower pole calculus Cystoscopy no abnormalities  2.  Stress urinary incontinence   HPI: 68 y.o. female presents for annual follow-up.  Doing well since last visit She has retired and states she is feeling much better Denies dysuria, gross hematuria Denies flank, abdominal or pelvic pain    PMH: Past Medical History:  Diagnosis Date   Abnormal TSH    Adenomatous colon polyp    Anemia    Basal cell carcinoma    Complication of anesthesia    HARD TIME WAKING UP AFTER HYSTERECTOMY   Decreased fibrinogen  (HCC)    Elevated TSH    Family history of adverse reaction to anesthesia    PT UNSURE   Fatigue    History of kidney stones    Intraductal papilloma of breast, left 2019   MVP (mitral valve prolapse)    DX AT AGE 54-PT ASYMPTOMATIC AND HAS NEVER SEEN CARDIOLOGIST OR HAD ECHO DONE   Osteopenia    Retinal tear    Rosacea    Vasomotor instability    Yeast infection     Surgical History: Past Surgical History:  Procedure Laterality Date   BASAL CELL CARCINOMA EXCISION  abd   BREAST DUCTAL SYSTEM EXCISION Left 06/07/2017   Procedure: EXCISION DUCTAL SYSTEM BREAST;  Surgeon: Dessa Reyes ORN, MD;  Location: [facility] ORS;  Service: General;  Laterality: Left;   BREAST EXCISIONAL BIOPSY Left 06/07/2017   SCLEROSING INTRADUCTAL PAPILLOMA, 4 MM   COLONOSCOPY WITH PROPOFOL  N/A 10/05/2015   Procedure: COLONOSCOPY WITH PROPOFOL ;  Surgeon: Lamar ONEIDA Holmes, MD;  Location: [facility] ENDOSCOPY;  Service: Endoscopy;  Laterality: N/A;   VAGINAL HYSTERECTOMY  2010   [facility]   WISDOM TOOTH EXTRACTION      Home Medications:  Allergies as of 12/25/2023   No Known Allergies       Medication List        Accurate as of December 25, 2023  1:00 PM. If you have any questions, ask your nurse or doctor.          STOP taking these medications    estradiol  0.025 MG/24HR Commonly known as: Vivelle -Dot Stopped by: Glendia JAYSON Barba       TAKE these medications    aspirin-acetaminophen -caffeine 250-250-65 MG tablet Commonly known as: EXCEDRIN MIGRAINE Take 1 tablet by mouth every 6 (six) hours as needed for headache.   Azelaic Acid 15 % gel Apply 1 application topically daily.   fluticasone 50 MCG/ACT nasal spray Commonly known as: FLONASE Place 1 spray into both nostrils daily as needed.   olopatadine 0.1 % ophthalmic solution Commonly known as: PATANOL Place 1 drop into both eyes 2 (two) times daily as needed for allergies (seasonal).         Family History: Family History  Problem Relation Age of Onset   Hearing loss Mother    Stroke Father    Hypertension Father    Hyperlipidemia Father    Hearing loss Father    Vascular Disease Father    Hypertension Brother    Hyperlipidemia Brother    Hearing loss Brother    Cancer Brother    Breast cancer Maternal Aunt 74   Colon cancer  Paternal Aunt    Hearing loss Maternal Grandmother    Heart disease Maternal Grandmother    Hypertension Maternal Grandfather    Hyperlipidemia Maternal Grandfather    Early death Paternal Grandfather    Diabetes Neg Hx    Ovarian cancer Neg Hx     Social History:  reports that she has never smoked. She has never used smokeless tobacco. She reports that she does not drink alcohol and does not use drugs.   Physical Exam: BP (!) 152/71   Pulse 70   Ht 5' 5 (1.651 m)   Wt 120 lb (54.4 kg)   BMI 19.97 kg/m   Constitutional:  Alert, No acute distress. HEENT: [place] AT Respiratory: Normal respiratory effort, no increased work of breathing. Psychiatric: Normal mood and affect.    Pertinent Imaging: RUS performed 12/20/2023 was personally reviewed and interpreted.   I do not see any renal calculi on my review.  The study has not been interpreted by radiology    Assessment & Plan:    1.  Left nephrolithiasis No evidence of left nephrolithiasis on my review of RUS.  Awaiting radiology interpretation Follow-up 2 years and instructed call earlier for any flank pain or worsening urinary symptoms    Glendia JAYSON Barba, MD  [facility] [address] [place], [place] [address] [phone]

## 2023-12-26 NOTE — Telephone Encounter (Signed)
 Noted

## 2024-01-23 ENCOUNTER — Other Ambulatory Visit

## 2024-02-13 ENCOUNTER — Ambulatory Visit
Admission: RE | Admit: 2024-02-13 | Discharge: 2024-02-13 | Disposition: A | Discharge status: Home / Self Care | Source: Ambulatory Visit | Attending: Internal Medicine | Admitting: Internal Medicine

## 2024-02-13 DIAGNOSIS — Z780 Asymptomatic menopausal state: Secondary | ICD-10-CM | POA: Insufficient documentation

## 2024-02-17 ENCOUNTER — Telehealth: Payer: Self-pay | Admitting: Internal Medicine

## 2024-02-17 NOTE — Telephone Encounter (Signed)
 Lm to call office to schedule a Welcome to Medicare appointment, due before 05/21/2024. MyChart  message sent.  E2C2 please schedule.

## 2024-02-18 ENCOUNTER — Ambulatory Visit: Payer: Self-pay

## 2024-02-18 DIAGNOSIS — Z23 Encounter for immunization: Secondary | ICD-10-CM

## 2024-03-25 ENCOUNTER — Encounter: Payer: Self-pay | Admitting: Internal Medicine

## 2024-03-25 ENCOUNTER — Ambulatory Visit (INDEPENDENT_AMBULATORY_CARE_PROVIDER_SITE_OTHER): Admitting: Internal Medicine

## 2024-03-25 VITALS — BP 132/78 | HR 61 | Ht 65.0 in | Wt 117.6 lb

## 2024-03-25 DIAGNOSIS — Z0000 Encounter for general adult medical examination without abnormal findings: Secondary | ICD-10-CM

## 2024-03-25 DIAGNOSIS — Z87442 Personal history of urinary calculi: Secondary | ICD-10-CM

## 2024-03-25 NOTE — Progress Notes (Unsigned)
 Subjective:   Sharon Le is a 68 y.o. female who presents for a Welcome to Medicare Exam.   Allergies (verified) Patient has no known allergies.   History: Past Medical History:  Diagnosis Date   Abnormal TSH    Adenomatous colon polyp    Anemia    Basal cell carcinoma    Complication of anesthesia    HARD TIME WAKING UP AFTER HYSTERECTOMY   Decreased fibrinogen  (HCC)    Elevated TSH    Family history of adverse reaction to anesthesia    PT UNSURE   Fatigue    History of kidney stones    Intraductal papilloma of breast, left 2019   MVP (mitral valve prolapse)    DX AT AGE 58-PT ASYMPTOMATIC AND HAS NEVER SEEN CARDIOLOGIST OR HAD ECHO DONE   Osteopenia    Retinal tear    Rosacea    Vasomotor instability    Yeast infection    Past Surgical History:  Procedure Laterality Date   BASAL CELL CARCINOMA EXCISION  abd   BREAST DUCTAL SYSTEM EXCISION Left 06/07/2017   Procedure: EXCISION DUCTAL SYSTEM BREAST;  Surgeon: Dessa Reyes ORN, MD;  Location: [facility] ORS;  Service: General;  Laterality: Left;   BREAST EXCISIONAL BIOPSY Left 06/07/2017   SCLEROSING INTRADUCTAL PAPILLOMA, 4 MM   COLONOSCOPY WITH PROPOFOL  N/A 10/05/2015   Procedure: COLONOSCOPY WITH PROPOFOL ;  Surgeon: Lamar ONEIDA Holmes, MD;  Location: [facility] ENDOSCOPY;  Service: Endoscopy;  Laterality: N/A;   VAGINAL HYSTERECTOMY  2010   [facility]   WISDOM TOOTH EXTRACTION     Family History  Problem Relation Age of Onset   Hearing loss Mother    Stroke Father    Hypertension Father    Hyperlipidemia Father    Hearing loss Father    Vascular Disease Father    Hypertension Brother    Hyperlipidemia Brother    Hearing loss Brother    Cancer Brother    Breast cancer Maternal Aunt 29   Colon cancer Paternal Aunt    Hearing loss Maternal Grandmother    Heart disease Maternal Grandmother    Hypertension Maternal Grandfather    Hyperlipidemia Maternal Grandfather    Early death Paternal Grandfather    Diabetes Neg Hx     Ovarian cancer Neg Hx    Social History   Occupational History   Not on file  Tobacco Use   Smoking status: Never   Smokeless tobacco: Never  Vaping Use   Vaping status: Never Used  Substance and Sexual Activity   Alcohol use: No   Drug use: No   Sexual activity: Not Currently   Tobacco Counseling Counseling given: Not Answered  SDOH Screenings   Food Insecurity: No Food Insecurity (03/25/2024)  Housing: Unknown (03/25/2024)  Transportation Needs: No Transportation Needs (03/25/2024)  Utilities: Not At Risk (03/25/2024)  Depression (PHQ2-9): Low Risk  (03/25/2024)  Physical Activity: Insufficiently Active (03/25/2024)  Social Connections: Socially Isolated (03/25/2024)  Stress: No Stress Concern Present (03/25/2024)  Tobacco Use: Low Risk  (03/25/2024)  Health Literacy: Adequate Health Literacy (03/25/2024)   Depression Screen    03/25/2024    3:23 PM 11/12/2023    1:12 PM 10/17/2022    8:15 AM  PHQ 2/9 Scores  PHQ - 2 Score 0 0 0     Goals Addressed   None    Visit info / Clinical Intake: Medicare Wellness Visit Type:: Welcome to [employer] (IPPE) Medicare Wellness Visit Mode:: In-person (required for WTM) Interpreter Needed?: No  Pre-visit prep was completed: no AWV questionnaire completed by patient prior to visit?: no Living arrangements:: (!) lives alone Patient's Overall Health Status Rating: very good Typical amount of pain: none Does pain affect daily life?: no Are you currently prescribed opioids?: no  Dietary Habits and Nutritional Risks How many meals a day?: 3 Eats fruit and vegetables daily?: yes Most meals are obtained by: preparing own meals Diabetic:: no  Functional Status Activities of Daily Living (to include ambulation/medication): Independent Ambulation: Independent Medication Administration: Independent Home Management: Independent Manage your own finances?: (!) no Primary transportation is: driving Concerns about vision?: no *vision  screening is required for WTM* Concerns about hearing?: no  Fall Screening Falls in the past year?: 0 Number of falls in past year: 0 Was there an injury with Fall?: 0 Fall Risk Category Calculator: 0 Patient Fall Risk Level: Low Fall Risk  Fall Risk Patient at Risk for Falls Due to: No Fall Risks Fall risk Follow up: Falls evaluation completed  Home and Transportation Safety: All rugs have non-skid backing?: yes All stairs or steps have railings?: yes Grab bars in the bathtub or shower?: (!) no Have non-skid surface in bathtub or shower?: (!) no Good home lighting?: yes Regular seat belt use?: yes Hospital stays in the last year:: no  Cognitive Assessment Difficulty concentrating, remembering, or making decisions? : no Will 6CIT or Mini Cog be Completed: yes What year is it?: 0 points What month is it?: 0 points Give patient an address phrase to remember (5 components): ball, shoe, pen, watch, dog About what time is it?: 0 points Count backwards from 20 to 1: 0 points Say the months of the year in reverse: 0 points Repeat the address phrase from earlier: 0 points 6 CIT Score: 0 points  Advance Directives (For Healthcare) Does Patient Have a Medical Advance Directive?: No Would patient like information on creating a medical advance directive?: No - Patient declined  Reviewed/Updated  Reviewed/Updated: All         Objective:    Today's Vitals   03/25/24 1513  BP: 132/78  Pulse: 61  SpO2: 99%  Weight: 117 lb 9.6 oz (53.3 kg)  Height: 5' 5 (1.651 m)   Body mass index is 19.57 kg/m.   Physical Exam{(optional), or other factors deemed appropriate based on the beneficiary's medical and social history and current clinical standards. - TEXT BETWEEN BRACKETS WILL DISAPPEAR WHEN YOU SIGN THE ENCOUNTER:1}   Current Medications (verified) Outpatient Encounter Medications as of 03/25/2024  Medication Sig   aspirin-acetaminophen -caffeine (EXCEDRIN MIGRAINE) 250-250-65  MG tablet Take 1 tablet by mouth every 6 (six) hours as needed for headache.   Azelaic Acid 15 % cream Apply 1 application topically daily.    olopatadine (PATANOL) 0.1 % ophthalmic solution Place 1 drop into both eyes 2 (two) times daily as needed for allergies (seasonal).    fluticasone (FLONASE) 50 MCG/ACT nasal spray Place 1 spray into both nostrils daily as needed. (Patient not taking: Reported on 03/25/2024)   No facility-administered encounter medications on file as of 03/25/2024.   Hearing/Vision screen Vision Screening   Right eye Left eye Both eyes  Without correction     With correction 20/20 20/25 20/20    Immunizations and Health Maintenance Health Maintenance  Topic Date Due   Hepatitis C Screening  Never done   Zoster Vaccines- Shingrix (1 of 2) Never done   COVID-19 Vaccine (7 - 2025-26 season) 04/10/2024 (Originally 01/20/2024)   Mammogram  06/02/2024   Medicare  Annual Wellness (AWV)  03/25/2025   Colonoscopy  10/04/2025   DTaP/Tdap/Td (2 - Td or Tdap) 06/23/2028   Pneumococcal Vaccine: 50+ Years  Completed   Influenza Vaccine  Completed   DEXA SCAN  Completed   Meningococcal B Vaccine  Aged Out    EKG: {ekg findings:315101}     Assessment/Plan:  This is a routine wellness examination for Glady.  Patient Care Team: Marylynn Verneita CROME, MD as PCP - General (Internal Medicine)  I have personally reviewed and noted the following in the patient's chart:   Medical and social history Use of alcohol, tobacco or illicit drugs  Current medications and supplements including opioid prescriptions. Functional ability and status Nutritional status Physical activity Advanced directives List of other physicians Hospitalizations, surgeries, and ER visits in previous 12 months Vitals Screenings to include cognitive, depression, and falls Referrals and appointments  Orders Placed This Encounter  Procedures   EKG 12-Lead   In addition, I have reviewed and discussed with  patient certain preventive protocols, quality metrics, and best practice recommendations. A written personalized care plan for preventive services as well as general preventive health recommendations were provided to patient.   Verneita CROME Marylynn, MD   03/25/2024   Return in 1 year (on 03/25/2025).

## 2024-03-25 NOTE — Progress Notes (Unsigned)
 The patient is here for the Welcome to  Medicare  preventive visit     has a past medical history of Abnormal TSH, Adenomatous colon polyp, Anemia, Basal cell carcinoma, Complication of anesthesia, Decreased fibrinogen  (HCC), Elevated TSH, Family history of adverse reaction to anesthesia, Fatigue, History of kidney stones, Intraductal papilloma of breast, left (2019), MVP (mitral valve prolapse), Osteopenia, Retinal tear, Rosacea, Vasomotor instability, and Yeast infection.    reports that she has never smoked. She has never used smokeless tobacco. She reports that she does not drink alcohol and does not use drugs.   The roster of all physicians providing medical care to patient : Patient Care Team: Marylynn Verneita CROME, MD as PCP - General (Internal Medicine)  Activities of daily living:  The patient is 100% independent in all ADLs: dressing, toileting, feeding as well as independent mobility Fall risk was assessed by direct patient evaluation of patient's balance, gait and ability to risk from a chair and from a kneeling position. Home safety : The patient has smoke detectors in the home. They wear seatbelts.  There are no firearms at home. There is no violence in the home.  Patient has seen their eye doctor in the last year.   Visual acuity was assessed today  and was 20/20 with correction lenses. Patient denies hearing difficulty with regard to whispered voices and some television programs and has  deferred audiologic testing in the last year.   There is no risks for hepatitis, STDs or HIV. There is no   history of blood transfusion. They have no travel history to infectious disease endemic areas of the world.  The patient has seen their dentist  Krystal Lyme in the last year ,had a crown placed last year . SABRA  They do not  have excessive sun exposure. Discussed the need for sun protection: hats, long sleeves and use of sunscreen if there is significant sun exposure.  Due to a history of  BCC and  maternal h/o melanoma    Diet: the importance of a healthy diet is discussed. They do have a healthy diet.  The benefits of regular aerobic exercise were discussed. Patient walks 5 times per week ,  and hikes,  does yard work .   Depression screen:      03/25/2024    3:23 PM 11/12/2023    1:12 PM 10/17/2022    8:15 AM  Depression screen PHQ 2/9  Decreased Interest 0 0 0  Down, Depressed, Hopeless 0 0 0  PHQ - 2 Score 0 0 0       Cognitive assessment: the patient manages all their financial and personal affairs and is actively engaged. They could relate day,date,year and events; recalled 2/3 objects at 3 minutes; performed clock-face test normally.  The following portions of the patient's history were reviewed and updated as appropriate: allergies, current medications, past family history, past medical history,  past surgical history, past social history  and problem list.  During the course of the visit the patient was educated and counseled about appropriate screening and preventive services including : fall prevention , diabetes screening, nutrition counseling, colorectal cancer screening, and recommended immunizations   Immunization History  Administered Date(s) Administered   INFLUENZA, HIGH DOSE SEASONAL PF 02/18/2024   Influenza,inj,Quad PF,6+ Mos 01/28/2019, 02/22/2021, 02/20/2022, 02/12/2023   Influenza-Unspecified 01/29/2017, 02/06/2018, 01/27/2020   PFIZER Comirnaty(Gray Top)Covid-19 Tri-Sucrose Vaccine 01/16/2021   PFIZER(Purple Top)SARS-COV-2 Vaccination 07/29/2019, 08/19/2019, 03/07/2020   PNEUMOCOCCAL CONJUGATE-20 11/12/2023   Pfizer(Comirnaty)Fall Seasonal Vaccine  12 years and older 04/23/2022, 04/25/2023   Tdap 06/23/2018  HMLISTPATIENTFRIENDLY@ Health Maintenance Due  Topic Date Due   Hepatitis C Screening  Never done   Zoster Vaccines- Shingrix (1 of 2) Never done    Last skin cancer screening : annually by [facility] Jan 2025      Vital Signs: BP 132/78    Pulse 61   Ht 5' 5 (1.651 m)   Wt 117 lb 9.6 oz (53.3 kg)   SpO2 99%   BMI 19.57 kg/m    Exam: General appearance: alert, cooperative and appears stated age Head: Normocephalic, without obvious abnormality, atraumatic Eyes: conjunctivae/corneas clear. PERRL, EOM's intact. Fundi benign. Ears: normal TM's and external ear canals both ears Nose: Nares normal. Septum midline. Mucosa normal. No drainage or sinus tenderness. Throat: lips, mucosa, and tongue normal; teeth and gums normal Neck: no adenopathy, no carotid bruit, no JVD, supple, symmetrical, trachea midline and thyroid  not enlarged, symmetric, no tenderness/mass/nodules Lungs: clear to auscultation bilaterally Breasts: normal appearance, no masses or tenderness Heart: regular rate and rhythm, S1, S2 normal, no murmur, click, rub or gallop Abdomen: soft, non-tender; bowel sounds normal; no masses,  no organomegaly Extremities: extremities normal, atraumatic, no cyanosis or edema Pulses: 2+ and symmetric Skin: Skin color, texture, turgor normal. No rashes or lesions Neurologic: Alert and oriented X 3, normal strength and tone. Normal symmetric reflexes. Normal coordination and gait.     End of Life Discussion and Planning   During the course of the visit , End of Life objectives were discussed at length.  Patient does not have a living will in place or a healthcare power of attorney.  Patient  was given printed information about advance directives and encouraged to return after discussing with their family.  Review of Opioid Prescriptions    Patient does not have a current opioid prescription   Patient's risk factors for opioid use disorder was reviewed and includes nothing  Treatment of pain using non-opioid alternatives was reviewed  and encouraged   Patient risk for potential substance abuse was assessed and addressed with counselling.   Assessment and Plan  History of urinary stone Has a 3 mm left renal stone  by  last ultrasound   Welcome to Medicare preventive visit age appropriate education and counseling updated, referrals for preventative services and immunizations addressed, dietary and smoking counseling addressed, most recent labs reviewed.  I have personally reviewed and have noted:   1) the patient's medical and social history 2) The pt's use of alcohol, tobacco, and illicit drugs 3) The patient's current medications and supplements 4) Functional ability including ADL's, fall risk, home safety risk, hearing and visual impairment 5) Diet and physical activities 6) Evidence for depression or mood disorder 7) The patient's height, weight, and BMI have been recorded in the chart 8) I have ordered and reviewed a 12 lead EKG and find that there are no acute changes and patient is in sinus rhythm.     I have made referrals, and provided counseling and education based on review of the above

## 2024-03-25 NOTE — Assessment & Plan Note (Signed)
 Has a 3 mm left renal stone  by last ultrasound

## 2024-03-25 NOTE — Assessment & Plan Note (Signed)

## 2024-06-11 ENCOUNTER — Other Ambulatory Visit: Payer: Self-pay | Admitting: Internal Medicine

## 2024-06-11 DIAGNOSIS — Z1231 Encounter for screening mammogram for malignant neoplasm of breast: Secondary | ICD-10-CM

## 2024-07-27 ENCOUNTER — Encounter

## 2024-11-12 ENCOUNTER — Encounter: Admitting: Internal Medicine

## 2025-03-29 ENCOUNTER — Ambulatory Visit

## 2025-03-30 ENCOUNTER — Ambulatory Visit
